# Patient Record
Sex: Male | Born: 1975 | Race: Black or African American | Hispanic: No | Marital: Married
Health system: Midwestern US, Community
[De-identification: ages and names within clinical notes are randomized; demographics above are authoritative.]

## PROBLEM LIST (undated history)

## (undated) DIAGNOSIS — Q211 Atrial septal defect, unspecified: Secondary | ICD-10-CM

## (undated) DIAGNOSIS — I639 Cerebral infarction, unspecified: Secondary | ICD-10-CM

## (undated) DIAGNOSIS — E785 Hyperlipidemia, unspecified: Secondary | ICD-10-CM

## (undated) HISTORY — DX: Hyperlipidemia, unspecified: E78.5

## (undated) HISTORY — PX: OTHER SURGICAL HISTORY: SHX169

## (undated) HISTORY — DX: Atrial septal defect, unspecified: Q21.10

## (undated) HISTORY — DX: Atrial septal defect: Q21.1

## (undated) HISTORY — DX: Cerebral infarction, unspecified: I63.9

---

## 2012-07-01 ENCOUNTER — Telehealth: Payer: Self-pay | Admitting: Cardiology

## 2012-07-01 NOTE — Telephone Encounter (Signed)
Spoke with patient who has appointment tomorrow with Dr. Antoine Poche for f/u s/p PFO at [facility] in [place], [place].  Patient states his cardiologist in [place], Dr. San Morelle recommends that he has ECG and ECHO one week, one month, and 6 months after procedure.  Patient questioned whether we have records from [place] and I informed him that Dr. Antoine Poche and Elita Quick are not here today. Patient asked if we had received his medical records from [place] and I informed him I was unsure. Patient states he has records and is dropping off today for [place] to make copies and return his copies to him tomorrow.

## 2012-07-01 NOTE — Telephone Encounter (Signed)
LMTCB

## 2012-07-01 NOTE — Telephone Encounter (Signed)
New problem   Pt want to know if he can have a Echocardiogram tomorrow with his appt. Please call pt

## 2012-07-02 ENCOUNTER — Encounter: Payer: Self-pay | Admitting: Cardiology

## 2012-07-02 ENCOUNTER — Ambulatory Visit (INDEPENDENT_AMBULATORY_CARE_PROVIDER_SITE_OTHER): Payer: BC Managed Care – PPO | Admitting: Cardiology

## 2012-07-02 VITALS — BP 128/81 | HR 66 | Ht 72.0 in | Wt 248.6 lb

## 2012-07-02 DIAGNOSIS — Q211 Atrial septal defect: Secondary | ICD-10-CM

## 2012-07-02 NOTE — Patient Instructions (Addendum)
Please stop your Lipitor. Continue all other medications as listed.  Your physician has requested that you have an echocardiogram at the end of May. Echocardiography is a painless test that uses sound waves to create images of your heart. It provides your doctor with information about the size and shape of your heart and how well your heart's chambers and valves are working. This procedure takes approximately one hour. There are no restrictions for this procedure.  Follow up with Dr Antoine Poche in 3 months.

## 2012-07-02 NOTE — Progress Notes (Signed)
HPI The patient presents for evaluation of a CVA. The patient was hospitalized in [place] for this. This was in mid April. I have reviewed outside records kindly provided. He had an acute lacunar left thalamic infarct.  He had an ASD.  He was treated with a Gore Helex ASD occluder. He had no residual deficit from his stroke. His initial complaint was visual. He has been managed with aspirin and Plavix. He has a neurology appointment pending. He has been back to doing activities though this was limited per protocol postprocedure. He denies any ongoing symptoms and in particular has had no motor or speech or visual disturbances. The patient denies any new symptoms such as chest discomfort, neck or arm discomfort. There has been no new shortness of breath, PND or orthopnea. There have been no reported palpitations, presyncope or syncope.  No Known Allergies  Current Outpatient Prescriptions  Medication Sig Dispense Refill  . aspirin 325 MG tablet Take 325 mg by mouth daily.      . cetirizine (ZYRTEC) 10 MG tablet Take 10 mg by mouth daily.      . clopidogrel (PLAVIX) 75 MG tablet Take 75 mg by mouth daily.       No current facility-administered medications for this visit.    Past Medical History  Diagnosis Date  . ASD (atrial septal defect)     Gore Helex Occluder ( REF [id number], LOT [id number])  . Dyslipidemia     Past Surgical History  Procedure Laterality Date  . 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 CAD Father 46  . Diabetes Mother     Type II    History   Social History  . Marital Status: Married    Spouse Name: N/A    Number of Children: 2  . Years of Education: N/A   Occupational History  .     Social History Main Topics  . Smoking status: Former Smoker    Types: Cigarettes    Start date: 07/02/1992  . Smokeless tobacco: Not on file  . Alcohol Use: Not on file  . Drug Use: Not on file  . Sexually Active: Not  on file   Other Topics Concern  . Not on file   Social History Narrative   Lives at home with wife.     ROS: As stated in the HPI and negative for all other systems.  PHYSICAL EXAM BP 128/81  Pulse 66  Ht 6' (1.829 m)  Wt 248 lb 9.6 oz (112.764 kg)  BMI 33.71 kg/m2 GENERAL:  Well appearing HEENT:  Pupils equal round and reactive, fundi not visualized, oral mucosa unremarkable NECK:  No jugular venous distention, waveform within normal limits, carotid upstroke brisk and symmetric, no bruits, no thyromegaly LYMPHATICS:  No cervical, inguinal adenopathy LUNGS:  Clear to auscultation bilaterally BACK:  No CVA tenderness CHEST:  Unremarkable HEART:  PMI not displaced or sustained,S1 and S2 within normal limits, no S3, no S4, no clicks, no rubs, no murmurs ABD:  Flat, positive bowel sounds normal in frequency in pitch, no bruits, no rebound, no guarding, no midline pulsatile mass, no hepatomegaly, no splenomegaly EXT:  2 plus pulses throughout, no edema, no cyanosis no clubbing SKIN:  No rashes no nodules NEURO:  Cranial nerves II through XII grossly intact, motor grossly intact throughout PSYCH:  Cognitively intact, oriented to person place and time   EKG:  Sinus  rhythm, rate 66, axis within normal limits, intervals within normal limits, no acute ST-T wave changes.   ASSESSMENT AND PLAN  ASD.  The patient is status post a Gore Helex septal occluder.  He is now greater than two weeks post proceder and he can begin to increase his activity level. I gave him specific instructions on this.  He will remain on antiplatelet agents for 6 months. Given the fact that he did have a stroke associated with this for now he will continue aspirin and Plavix. Eventually I will likely continue him on a low dose of aspirin only. I will also await for opinion from neurology. Per protocol for this device he will need an echocardiogram one month post placement, 6 months post and 12 months post. I will  arrange this.  DYSLIPIDEMIA:  The patient recalls his most recent LDL to be in the 140s. He does not recall being told he had coronary disease. Current guidelines would not suggest a statin in this situation in the absence of vascular disease. Therefore, he can discontinue his Lipitor which he would prefer.

## 2012-07-15 ENCOUNTER — Ambulatory Visit (INDEPENDENT_AMBULATORY_CARE_PROVIDER_SITE_OTHER): Payer: BC Managed Care – PPO | Admitting: Neurology

## 2012-07-15 ENCOUNTER — Encounter: Payer: Self-pay | Admitting: Neurology

## 2012-07-15 ENCOUNTER — Other Ambulatory Visit: Payer: Self-pay | Admitting: Neurology

## 2012-07-15 VITALS — BP 112/78 | HR 68 | Temp 97.7°F | Resp 16 | Wt 249.0 lb

## 2012-07-15 DIAGNOSIS — Q211 Atrial septal defect: Secondary | ICD-10-CM

## 2012-07-15 DIAGNOSIS — I6350 Cerebral infarction due to unspecified occlusion or stenosis of unspecified cerebral artery: Secondary | ICD-10-CM

## 2012-07-15 DIAGNOSIS — I639 Cerebral infarction, unspecified: Secondary | ICD-10-CM

## 2012-07-15 DIAGNOSIS — Q2112 Patent foramen ovale: Secondary | ICD-10-CM

## 2012-07-15 NOTE — Progress Notes (Signed)
Nathan Mayer is a 37 year old male who is now status post repair of atrial septal defect 4 weeks ago.  He was driving in the middle of the day in [place] around one or 2 PM when he developed blurred vision in his entire visual field.  He saw 2 or 3 sets of white lines going down the road in the fall of the midline and he pulled off to the side.  He was able to walk around at that point and the medics came and while he was on the gurney, he also developed other symptoms such as garbled speech but also did not make sense.  In other words the words that he spoke were not only garbled but they were the wrong words for that situation and not the words he was thinking in his mind.  He failed a drift test having tripped on the right and he had difficulty counting fingers in the right visual field. He states that all of these deficits cleared up by 6 PM that day, or within 4-5 hours.  He had a CAT scan of the head and then he had an MRI which showed increased signal on the T2 sequences in the left thalamus.  He also MRI and MRA which were unrevealing and he had the echocardiogram which revealed the patent foramen ovale.  He is now being followed by cardiology on aspirin and Plavix together.  By the cardiologist feels after 6 months he may be able to go on aspirin alone in terms of the septal defect repair.  He is now referred for an opinion as to whether this would be appropriate from a stroke or a RIND point of view.  Review of symptoms is basically negative as he feels healthy and he does not have high blood pressure and all of his symptoms from the rind or stroke have resolved.  Past Medical History  Diagnosis Date  . ASD (atrial septal defect)     Gore Helex Occluder ( REF [id number], LOT [id number])  . Dyslipidemia     Current Outpatient Prescriptions on File Prior to Visit  Medication Sig Dispense Refill  . aspirin 325 MG tablet Take 325 mg by mouth daily.      . cetirizine (ZYRTEC) 10 MG tablet Take 10 mg by  mouth daily.      . clopidogrel (PLAVIX) 75 MG tablet Take 75 mg by mouth daily.       No current facility-administered medications on file prior to visit.   Review of patient's allergies indicates no known allergies.  History   Social History  . Marital Status: Married    Spouse Name: N/A    Number of Children: 2  . Years of Education: N/A   Occupational History  .     Social History Main Topics  . Smoking status: Former Smoker    Types: Cigarettes    Start date: 07/02/1992  . Smokeless tobacco: Never Used  . Alcohol Use: Yes  . Drug Use: Not on file  . Sexually Active: Not on file   Other Topics Concern  . Not on file   Social History Narrative   Lives at home with wife.     Family History  Problem Relation Age of Onset  . CAD Father 20  . Diabetes Mother     Type II    BP 112/78  Pulse 68  Temp(Src) 97.7 F (36.5 C)  Resp 16  Wt 249 lb (112.946 kg)  BMI 33.76 kg/m2  Alert  and oriented x 3.  Memory function appears to be intact.  Concentration and attention are normal for educational level and background.  Speech is fluent and without significant word finding difficulty.  Is aware of current events.  No carotid bruits detected.  Cranial nerve II through XII are within normal limits.  This includes normal optic discs and acuity, EOMI, PERLA, facial movement and sensation intact, hearing grossly intact, gag intact,Uvula raises symmetrically and tongue protrudes evenly. Motor strength is 5 over 5 throughout all limbs.  No atrophy, abnormal tone or tremors. Reflexes are 1+ and symmetric in the upper and lower extremities Sensory exam is intact. Coordination is intact for fine movements and rapid alternating movements in all limbs Gait and station are normal.   Impression: 1. RIND or reversible ischemic neurologic deficit clinically resolved within 5-6 hours.  He does show a lesion in the left Thalamus on the T2 images of the MR I. Given the lack of other risk  factors, it does seem highly probable that the septal defect is the primary etiology of the event.  Plan: Continue his Plavix and aspirin for now.  In terms of what is best after 6 months, we will get an opinion from Dr. Pearlean Brownie at [facility] in 4 months as to what is the best regimen going forward. Return here  p.r.n.

## 2012-07-16 ENCOUNTER — Telehealth: Payer: Self-pay | Admitting: *Deleted

## 2012-07-16 MED ORDER — ASPIRIN 325 MG PO TABS: 325.0000 mg | ORAL_TABLET | Freq: Every day | ORAL | Status: DC

## 2012-07-16 MED ORDER — CLOPIDOGREL BISULFATE 75 MG PO TABS: 75.0000 mg | ORAL_TABLET | Freq: Every day | ORAL | Status: DC

## 2012-07-16 NOTE — Telephone Encounter (Signed)
Patient calling to see if Dr Antoine Poche will refill his rx for Aspirin 325mg  daily and Plavix 75mg . He states he only has one month left and they were not originally prescriped by Dr Antoine Poche but would like to know if this is possible and can he get a return call either way. I let him know I will forward this to his (Dr Antoine Poche) nurse for approval and someone will call him back whether it has been approved.   Micki Riley, CMA

## 2012-07-16 NOTE — Addendum Note (Signed)
Addended by: Sharin Grave on: 07/16/2012 02:56 PM   Modules accepted: Orders

## 2012-07-18 ENCOUNTER — Ambulatory Visit (HOSPITAL_COMMUNITY): Payer: BC Managed Care – PPO | Attending: Cardiology

## 2012-07-18 DIAGNOSIS — I079 Rheumatic tricuspid valve disease, unspecified: Secondary | ICD-10-CM | POA: Insufficient documentation

## 2012-07-18 DIAGNOSIS — Z8673 Personal history of transient ischemic attack (TIA), and cerebral infarction without residual deficits: Secondary | ICD-10-CM | POA: Insufficient documentation

## 2012-07-18 DIAGNOSIS — Z87891 Personal history of nicotine dependence: Secondary | ICD-10-CM | POA: Insufficient documentation

## 2012-07-18 DIAGNOSIS — Q211 Atrial septal defect: Secondary | ICD-10-CM

## 2012-07-18 DIAGNOSIS — I059 Rheumatic mitral valve disease, unspecified: Secondary | ICD-10-CM | POA: Insufficient documentation

## 2012-07-18 DIAGNOSIS — Q2111 Secundum atrial septal defect: Secondary | ICD-10-CM | POA: Insufficient documentation

## 2012-07-18 NOTE — Progress Notes (Signed)
Echocardiogram performed.  

## 2012-07-23 NOTE — Telephone Encounter (Signed)
Called and left message for pt of results and to call back with any questions

## 2012-07-23 NOTE — Telephone Encounter (Signed)
New problem   Pt want to know results of his echocardiogram done on 07/18/12. Please call pt

## 2012-07-29 ENCOUNTER — Telehealth: Payer: Self-pay | Admitting: Cardiology

## 2012-07-29 ENCOUNTER — Telehealth: Payer: Self-pay | Admitting: *Deleted

## 2012-07-29 ENCOUNTER — Encounter: Payer: Self-pay | Admitting: Neurology

## 2012-07-29 ENCOUNTER — Ambulatory Visit (INDEPENDENT_AMBULATORY_CARE_PROVIDER_SITE_OTHER): Payer: BC Managed Care – PPO | Admitting: Neurology

## 2012-07-29 VITALS — BP 110/64 | HR 64 | Ht 72.0 in | Wt 259.0 lb

## 2012-07-29 DIAGNOSIS — I639 Cerebral infarction, unspecified: Secondary | ICD-10-CM

## 2012-07-29 DIAGNOSIS — I6350 Cerebral infarction due to unspecified occlusion or stenosis of unspecified cerebral artery: Secondary | ICD-10-CM

## 2012-07-29 HISTORY — DX: Cerebral infarction, unspecified: I63.9

## 2012-07-29 NOTE — Progress Notes (Signed)
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  He was previously healthy,in 06/14/2012 he was driving in the middle of the day in [place] around one PM when he developed blurred vision in his entire visual field, slurred speech, right facial drooping, he pulled over, his wife took over, he was able to walk around his vehicle, sat to the passenger side, then he beccome unresponsive, incoherent, difficulty with language, difficulty raising both arm against gravity, his wife called 911, he could walk towards ambulance, he was taken to ER at [facility],  He received iv tPA, by 2nd hours after symptoms onset, he had rapid symptoms improvement,   by 6pm, he was stroke ICU talking on the phone, back to normal.  MRI of the brain with and without contrast April 19th demonstrated an acute small left thalamic stroke, Workup has demonstrated atrial septal defect, he received Gore Helex ASD occluder at [facility]. Was noted to have elevated LDL 140, was put on Lipitor, also aspirin and Plavix, the plan is to keep double treatment with six-month, then aspirin 325 mg alone I he was evaluated by cardiologist  Dr. Gayland Curry.  Lipitor was stopped. He now has no residual neurological deficit.   Repeat echocardiogram may 20 second showed ejection fraction 50-55%, color Doppler showed small residual defect at ASD closure device  He was also evaluated by [facility] neurologist Dr. Smiley Houseman in May 19th, who suggested second opinion by stroke physician Dr. Pearlean Brownie  Review of Systems  Out of a complete 14 system review, the patient complains of only the following symptoms, and all other reviewed systems are negative.   Constitutional:   N/A Cardiovascular:  N/A Ear/Nose/Throat:  N/A Skin: N/A Eyes: N/A Respiratory: N/A Gastroitestinal: N/A    Hematology/Lymphatic:  N/A Endocrine:   N/A Musculoskeletal:N/A Allergy/Immunology: N/A Neurological: N/A Psychiatric:    N/A  PHYSICAL EXAMINATOINS:  Generalized: In no acute distress  Neck: Supple, no carotid bruits   Cardiac: Regular rate rhythm  Pulmonary: Clear to auscultation bilaterally  Musculoskeletal: No deformity  Neurological examination  Mentation: Alert oriented to time, place, history taking, and causual conversation  Assessment and plan:  37 years old [place] American male, with embolic stroke,  Cranial nerve II-XII: Pupils were equal round reactive to light extraocular movements were full, visual field were full on confrontational test. facial sensation and strength were normal. hearing was intact to finger rubbing bilaterally. Uvula tongue midline.  head turning and shoulder shrug and were normal and symmetric.Tongue protrusion into cheek strength was normal.  Motor: normal tone, bulk and strength.  Sensory: Intact to fine touch, pinprick, preserved vibratory sensation, and proprioception at toes.  Coordination: Normal finger to nose, heel-to-shin bilaterally there was no truncal ataxia  Gait: Rising up from seated position without assistance, normal stance, without trunk ataxia, moderate stride, good arm swing, smooth turning, able to perform tiptoe, and heel walking without difficulty.   Romberg signs: Negative  Deep tendon reflexes: Brachioradialis 2/2, biceps 2/2, triceps 2/2, patellar 2/2, Achilles 2/2, plantar responses were flexor bilaterally.  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 transcranial Doppler, right-to-left shunt monitoring 3 consult Dr. Pearlean Brownie

## 2012-07-29 NOTE — Telephone Encounter (Signed)
Walk in pt Form " Pt has Additional Questions" sent to Pam/Hochrein 07/29/12/KM

## 2012-07-29 NOTE — Telephone Encounter (Signed)
Received walk in sheet stating pt has further questions.  Called number listed and got a voicemail - left message to call back with questions/concerns.

## 2012-07-30 ENCOUNTER — Ambulatory Visit (INDEPENDENT_AMBULATORY_CARE_PROVIDER_SITE_OTHER): Payer: BC Managed Care – PPO | Admitting: Neurology

## 2012-07-30 ENCOUNTER — Encounter: Payer: Self-pay | Admitting: Neurology

## 2012-07-30 VITALS — BP 111/71 | HR 66 | Temp 97.4°F | Ht 72.0 in | Wt 249.0 lb

## 2012-07-30 DIAGNOSIS — E7849 Other hyperlipidemia: Secondary | ICD-10-CM

## 2012-07-30 DIAGNOSIS — I6350 Cerebral infarction due to unspecified occlusion or stenosis of unspecified cerebral artery: Secondary | ICD-10-CM

## 2012-07-30 DIAGNOSIS — E782 Mixed hyperlipidemia: Secondary | ICD-10-CM

## 2012-07-30 MED ORDER — ROSUVASTATIN CALCIUM 5 MG PO TABS: 5.0000 mg | ORAL_TABLET | Freq: Every day | ORAL | Status: DC

## 2012-07-30 NOTE — Progress Notes (Signed)
[facility] [address] [place]. [place] [address] [phone]       OFFICE CONSULT NOTE  Mr. Nathan Mayer Date of Birth:  [birth year] Medical Record Number:  [id number]   Referring MD: Nathan Hopper, MD & Dr Nathan Mayer Reason for Referral:  Stroke HPI: Nathan Mayer is a 37 year old  African American male who  was previously healthy,until 06/14/2012 he was driving in the middle of the day in [place] around 1 PM when he developed blurred vision in his entire visual field, slurred speech, right facial drooping, he pulled over, his wife took over, he was able to walk around his vehicle, sat to the passenger side, then he beccome unresponsive, incoherent, difficulty with language, difficulty raising both arm against gravity, his wife called 911, he could walk towards ambulance, he was taken to ER at [facility],  He received iv tPA, within 2 hours after symptoms onset, he had rapid symptoms improvement,   by 6pm, he was recovered in ICU talking on the phone, back to normal.  MRI of the brain with and without contrast April 19th personally reviewed demonstrated an acute small left medial thalamic infarct Workup has demonstrated atrial septal defect on transthoraxic echo ( actual report and images not available), he received Gore Helex ASD occluder at [facility]. Was noted to have elevated LDL 140, was put on Lipitor, also aspirin and Plavix, the plan is to keep double treatment with six-month, then aspirin 325 mg alone I he was evaluated by cardiologist  Dr. Kirtland Mayer.  Lipitor was stopped `` for lack of vascular risk``. He now has no residual neurological deficit.  CT angiogram of the brain and neck showed no significant extracranial or intestinal stenosis. CT perfusion study was normal. Repeat echocardiogram 07/16/12 second showed ejection fraction 50-55%, color Doppler showed small residual defect at ASD closure device Lower extremity venous Doppler was negative  for deep and thrombosis.  He was also evaluated by [facility] neurologist Dr. Smiley Mayer in May 19th, who suggested second opinion by me. He is no prior history of DVT, pulmonary embolism or significant health problems. 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 He does have family history of strokes and heart attacks in his dad and paternal uncles in their 4s. He denies excessive intake of alcohol, caffeinated drinks, health  drinks, marijuana, drugs of abuse or smoking. He quit smoking in 1995.  ROS:   14 system review of systems is positive for mild memory difficulties only. PMH:  Past Medical History  Diagnosis Date  . ASD (atrial septal defect)     Gore Helex Occluder ( REF [id number], LOT [id number])  . Dyslipidemia    Left medial thalamic infarct 06/14/2012  Social History:  History   Social History  . Marital Status: Married    Spouse Name: Nathan Mayer    Number of Children: 2  . 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 Smoking status: Former Smoker    Types: Cigarettes    Start date: 07/02/1992  . Smokeless tobacco: Never Used  . Alcohol Use: 1.2 oz/week    2 Cans of beer per week     Comment: Social  . Drug Use: No  . Sexually Active: Not on file   Other Topics Concern  . Not on file   Social History Narrative   Lives at home with wife Nathan Mayer ). Patient works for [employer], Art gallery manager,  and has a college education. Drinks caffeine daily.    Medications:   Current Outpatient Prescriptions on File Prior to Visit  Medication Sig Dispense Refill  . aspirin 325 MG tablet Take 1 tablet (325 mg total) by mouth daily.  30 tablet  6  . cetirizine (ZYRTEC) 10 MG tablet Take 10 mg by mouth as needed.       . clopidogrel (PLAVIX) 75 MG tablet Take 1 tablet (75 mg total) by mouth daily.  30 tablet  6   No current facility-administered medications on file prior to visit.    Allergies:  No Known Allergies  Physical  Exam General: well developed, well nourished , seated, in no evident distress Head: head normocephalic and atraumatic. Orohparynx benign Neck: supple with no carotid or supraclavicular bruits Cardiovascular: regular rate and rhythm, no murmurs Musculoskeletal: no deformity Skin:  no rash/petichiae Vascular:  Normal pulses all extremities  Neurologic Exam Mental Status: Awake and fully alert. Oriented to place and time. Recent and remote memory intact. Attention span, concentration and fund of knowledge appropriate. Mood and affect appropriate. Diminished recall 2/3. Animal naming test 13 Cranial Nerves: Fundoscopic exam reveals sharp disc margins. Pupils equal, briskly reactive to light. Extraocular movements full without nystagmus. Visual fields full to confrontation. Hearing intact. Facial sensation intact. Face, tongue, palate moves normally and symmetrically.  Motor: Normal bulk and tone. Normal strength in all tested extremity muscles. Sensory.: intact to tough and pinprick and vibratory.  Coordination: Rapid alternating movements normal in all extremities. Finger-to-nose and heel-to-shin performed accurately bilaterally. Gait and Station: Arises from chair without difficulty. Stance is normal. Gait demonstrates normal stride length and balance . Able to heel, toe and tandem walk without difficulty.  Reflexes: 1+ and symmetric. Toes downgoing.     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 Finding of atrial septal defect status post endovascular closure on 06/18/12 using the  GORE Helix occluder device. Etiology of the stroke likely cryptogenic and vascular risk factors identified includes hyperlipidemia and family history only. Lower extremity venous Dopplers were negative for DVT though the  circumstances of the patient's clinical presentation do favor paradoxical embolism.    PLAN: Continue Plavix for secondary to prevention and am not sure he requires aspirin in addition since he has Helix occluder device which is not as thrombogenic . I will refer him to Dr. Tonny Bollman interventional cardiologist for his opinion with regards to this. Start Crestor 5 mg daily for his elevated LDL with goal below 100 mg percent. I have encouraged him to diet and exercise regularly. Check fasting lipid profile, hemoglobin A1c, ANA panel, hypercoagulable panel labs and sickle cell screen. Check transcranial Doppler bubble study for adequacy of ASD closure and emboli monitoring.

## 2012-07-30 NOTE — Patient Instructions (Addendum)
He was advised to continue aspirin and Plavix for stroke prevention as well as start Crestor 5 mg daily for his hyperlipidemia. I discussed possible side effects for the patient and advised him to calm if needed. Referred to Dr. Tonny Bollman cardiology for appropriate antiplatelet therapy post ASD closure. Check fasting lipid profile, hemoglobin A1c, ANA panel, hypercoagulable panel and sickle cell screen. Check transcranial Doppler bubble study with emboli monitoring. Return for followup in 3 months with Jerrye Bushy, NP

## 2012-08-01 ENCOUNTER — Other Ambulatory Visit: Payer: Self-pay | Admitting: *Deleted

## 2012-08-01 DIAGNOSIS — Q211 Atrial septal defect: Secondary | ICD-10-CM

## 2012-08-01 NOTE — Telephone Encounter (Signed)
Left message for pt to call back if further questions

## 2012-08-03 LAB — ANTITHROMBIN PANEL
AT III AG PPP IMM-ACNC: 73 % — ABNORMAL LOW (ref 75–130)
AntiThromb III Func: 84 % (ref 75–135)

## 2012-08-03 LAB — ANA: Anti Nuclear Antibody(ANA): NEGATIVE

## 2012-08-03 LAB — LUPUS ANTICOAGULANT
Dilute Viper Venom Time: 32.6 s (ref 0.0–55.1)
dPT Confirm Ratio: 0.99 Ratio (ref 0.00–1.20)

## 2012-08-03 LAB — HOMOCYSTEINE: Homocysteine: 11 umol/L (ref 0.0–15.0)

## 2012-08-03 LAB — LIPID PANEL
HDL: 35 mg/dL — ABNORMAL LOW (ref >39)
Triglycerides: 51 mg/dL (ref 0–149)
VLDL Cholesterol Cal: 10 mg/dL (ref 5–40)

## 2012-08-03 LAB — PROTEIN C DEFICIENCY PROFILE
Protein C Activity: 88 % (ref 74–151)
Protein C Antigen: 58 % — ABNORMAL LOW (ref 70–140)

## 2012-08-03 LAB — HEMOGLOBIN A1C: Hgb A1c MFr Bld: 6 % — ABNORMAL HIGH (ref 4.8–5.6)

## 2012-08-03 LAB — CARDIOLIPIN ANTIBODY: Anticardiolipin IgA: <9 APL U/mL (ref 0–11)

## 2012-08-07 ENCOUNTER — Telehealth: Payer: Self-pay | Admitting: Neurology

## 2012-08-08 NOTE — Telephone Encounter (Signed)
I spoke to the patient and gave him results of lab work which are pretty much unremarkable except for elevated bad and decreased good cholesterol. He was advised to continue Crestor 5 mg daily and keep appointment with Dr. Excell Seltzer cardiologist.

## 2012-08-08 NOTE — Progress Notes (Signed)
Lab results all same fairly normal except for elevated cholesterol for which I have already advised him to take Crestor. Check lipid panel 6 weeks after starting Crestor.

## 2012-08-09 NOTE — Telephone Encounter (Signed)
Pt continues to not return my calls.  Will await his return call

## 2012-09-20 ENCOUNTER — Other Ambulatory Visit: Payer: BC Managed Care – PPO

## 2012-09-20 ENCOUNTER — Ambulatory Visit (INDEPENDENT_AMBULATORY_CARE_PROVIDER_SITE_OTHER): Payer: BC Managed Care – PPO

## 2012-09-20 ENCOUNTER — Ambulatory Visit (INDEPENDENT_AMBULATORY_CARE_PROVIDER_SITE_OTHER): Payer: BC Managed Care – PPO | Admitting: Neurology

## 2012-09-20 ENCOUNTER — Encounter: Payer: Self-pay | Admitting: Neurology

## 2012-09-20 VITALS — BP 114/76 | HR 63 | Ht 72.0 in | Wt 249.0 lb

## 2012-09-20 DIAGNOSIS — I6350 Cerebral infarction due to unspecified occlusion or stenosis of unspecified cerebral artery: Secondary | ICD-10-CM

## 2012-09-20 DIAGNOSIS — Z0289 Encounter for other administrative examinations: Secondary | ICD-10-CM

## 2012-09-20 DIAGNOSIS — Q211 Atrial septal defect: Secondary | ICD-10-CM

## 2012-09-20 NOTE — Patient Instructions (Addendum)
Discontinue Aspirin and stay on Plavix along per second a stroke prevention has had do not see any benefit of doing detected therapy beyond a few months after PFO closure. Continue Crestor for hyperlipidemia and check followup lipid profile. Check TCD bubble study today for adequacy of endovascular ASD closure. Return for followup in 3 months

## 2012-09-20 NOTE — Progress Notes (Signed)
[facility] [address] [place]. [place] [address] [phone]       OFFICE CONSULT NOTE  Mr. Nathan Mayer Date of Birth:  May 24, 1975 Medical Record Number:  [id number]   Referring MD: Murriel Hopper, MD & Dr Terrace Arabia Reason for Referral:  Stroke HPI: Nathan Mayer is a 37 year old  African American male who  was previously healthy,until 06/14/2012 he was driving in the middle of the day in [place] around 1 PM when he developed blurred vision in his entire visual field, slurred speech, right facial drooping, he pulled over, his wife took over, he was able to walk around his vehicle, sat to the passenger side, then he beccome unresponsive, incoherent, difficulty with language, difficulty raising both arm against gravity, his wife called 911, he could walk towards ambulance, he was taken to ER at [facility],  He received iv tPA, within 2 hours after symptoms onset, he had rapid symptoms improvement,   by 6pm, he was recovered in ICU talking on the phone, back to normal.  MRI of the brain with and without contrast April 19th personally reviewed demonstrated an acute small left medial thalamic infarct Workup has demonstrated atrial septal defect on transthoraxic echo ( actual report and images not available), he received Gore Helex ASD occluder at [facility]. Was noted to have elevated LDL 140, was put on Lipitor, also aspirin and Plavix, the plan is to keep double treatment with six-month, then aspirin 325 mg alone I he was evaluated by cardiologist  Dr. Kirtland Bouchard.  Lipitor was stopped `` for lack of vascular risk``. He now has no residual neurological deficit.  CT angiogram of the brain and neck showed no significant extracranial or intestinal stenosis. CT perfusion study was normal. Repeat echocardiogram 07/16/12 second showed ejection fraction 50-55%, color Doppler showed small residual defect at ASD closure device Lower extremity venous Doppler was negative  for deep and thrombosis.  He was also evaluated by [facility] neurologist Dr. Smiley Houseman in May 19th, who suggested second opinion by me. He is no prior history of DVT, pulmonary embolism or significant health problems. 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 He does have family history of strokes and heart attacks in his dad and paternal uncles in their 38s. He denies excessive intake of alcohol, caffeinated drinks, health  drinks, marijuana, drugs of abuse or smoking. He quit smoking in 1995. 09/20/12  he returns today for followup after his last visit on 07/30/12. He continues to do well without recurrence stroke or TIA symptoms. He is tolerating Crestor well without significant myalgias or arthralgias. He remains on aspirin and Plavix and has not stopped aspirin and despite my advise at last visit. He has not yet seen Dr Tonny Bollman but I have discussed with him and he agrees with stopping aspirin. He did undergo lab work on 07/31/12 which showed total cholesterol 163, HDL 35, LDL 100 mg percent. ANA was negative. Hemoglobin A1c was borderline at 6.0. Sickle cell screen was negative. Lupus anticoagulant was negative. Antiphospholipid antibodies were negative. Protein S activity antithrombin 3 for normal. Protein C antigen was low but activity was normal. Homocystine was normal  ROS:   14 system review of systems is negative for any complaints todayPMH:  Past Medical History  Diagnosis Date  . ASD (atrial septal defect)     Gore Helex Occluder ( REF [id number], LOT [id number])  . 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 Marital Status: Married    Spouse Name: Nathan Mayer    Number of Children: 2  . 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 Smoking status: Former Smoker    Types: Cigarettes    Start date: 07/02/1992  . Smokeless tobacco: Never Used  . Alcohol Use: 1.2 oz/week    2  Cans of beer per week     Comment: Social  . Drug Use: No  . Sexually Active: Not on file   Other Topics Concern  . Not on file   Social History Narrative   Lives at home with wife Nathan Mayer ). Patient works for [employer], Art gallery manager, and has a Naval architect. Drinks caffeine daily.    Medications:   Current Outpatient Prescriptions on File Prior to Visit  Medication Sig Dispense Refill  . aspirin 325 MG tablet Take 1 tablet (325 mg total) by mouth daily.  30 tablet  6  . cetirizine (ZYRTEC) 10 MG tablet Take 10 mg by mouth as needed.       . clopidogrel (PLAVIX) 75 MG tablet Take 1 tablet (75 mg total) by mouth daily.  30 tablet  6  . rosuvastatin (CRESTOR) 5 MG tablet Take 1 tablet (5 mg total) by mouth daily.  60 tablet  1   No current facility-administered medications on file prior to visit.    Allergies:  No Known Allergies Filed Vitals:   09/20/12 1458  BP: 114/76  Pulse: 63    Physical Exam General: well developed, well nourished , seated, in no evident distress Head: head normocephalic and atraumatic. Orohparynx benign Neck: supple with no carotid or supraclavicular bruits Cardiovascular: regular rate and rhythm, no murmurs Musculoskeletal: no deformity Skin:  no rash/petichiae Vascular:  Normal pulses all extremities  Neurologic Exam Mental Status: Awake and fully alert. Oriented to place and time. Recent and remote memory intact. Attention span, concentration and fund of knowledge appropriate. Mood and affect appropriate. Diminished recall 2/3. Animal naming test 13 Cranial Nerves: Fundoscopic exam  not done  . Pupils equal, briskly reactive to light. Extraocular movements full without nystagmus. Visual fields full to confrontation. Hearing intact. Facial sensation intact. Face, tongue, palate moves normally and symmetrically.  Motor: Normal bulk and tone. Normal strength in all tested extremity muscles. Sensory.: intact to tough and pinprick and vibratory.   Coordination: Rapid alternating movements normal in all extremities. Finger-to-nose and heel-to-shin performed accurately bilaterally. Gait and Station: Arises from chair without difficulty. Stance is normal. Gait demonstrates normal stride length and balance . Able to heel, toe and tandem walk without difficulty.  Reflexes: 1+ and symmetric. Toes downgoing.     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 Finding of atrial septal defect status post endovascular closure on 06/18/12 using the  GORE Helix occluder device. Etiology of the stroke likely cryptogenic and vascular risk factors identified includes hyperlipidemia and family history only. Lower extremity venous Dopplers were negative for DVT though the circumstances of the patient's clinical presentation do favor paradoxical embolism.    PLAN: Continue Plavix for secondary to prevention and am not sure he requires aspirin in addition since he has Helix occluder device which is not as thrombogenic . I have discussed this with   Dr. Casimiro Needle  [facility] interventional cardiologist who agrees.. Continue Crestor 5 mg daily for his elevated LDL with goal below 100 mg percent. And check f/u lipids. I have encouraged him to diet and exercise regularly.  Check transcranial Doppler bubble study for adequacy of ASD closure and emboli monitoring.F/U in 3 months.       [facility]      [address]      [place]. [place] [address] [phone]       TRANSCRANIAL DOPPLER BUBBLE STUDY   Nathan Mayer Date of Birth:  [birth year] Medical Record Number:  [id number]   Indications: Diagnostic Date of Procedure: 09/20/2012 Clinical History:  37 year-old patient with stroke and atrial septal defect status post endovascular closure Technical  Description:   Transcranial Doppler Bubble Study was performed at the bedside after taking written informed consent from the patient and explaining risk/benefits. Both middle cerebral arteries were insonated using a headset. And IV line was inserted in the left forearm by the RN using aseptic precautions. Agitated saline injection at rest and after valsalva maneuver did  result in multiple high intensity transient signals (HITS) at rest and partial curtain sign after valsalva.  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 Questions were answered.Advised repeat TCD Bubble study in 3 months.

## 2012-09-23 ENCOUNTER — Telehealth: Payer: Self-pay | Admitting: Cardiology

## 2012-09-23 NOTE — Telephone Encounter (Signed)
New prob  Pt would like to speak to a nurse regarding a prescription for crestor.  He also has a question regarding coming off aspirin.

## 2012-09-23 NOTE — Telephone Encounter (Signed)
Spoke with patient who has questions regarding stopping ASA and not stopping Crestor.  Patient did not realize that these medications have different actions and was confused as to why Dr. Pearlean Brownie wanted him to continue Crestor.  I explained the differences in ASA and Crestor and patient verbalized understanding.  Patient states he would like to come off the Crestor but that he is scheduled to have f/u lipid panel in 3 weeks and has an appointment 8/19 with Dr. Antoine Poche and is comfortable waiting until then to discuss.

## 2012-10-14 ENCOUNTER — Encounter: Payer: Self-pay | Admitting: *Deleted

## 2012-10-14 ENCOUNTER — Encounter: Payer: Self-pay | Admitting: Cardiology

## 2012-10-15 ENCOUNTER — Ambulatory Visit (INDEPENDENT_AMBULATORY_CARE_PROVIDER_SITE_OTHER): Payer: BC Managed Care – PPO | Admitting: Cardiology

## 2012-10-15 ENCOUNTER — Encounter: Payer: Self-pay | Admitting: Cardiology

## 2012-10-15 VITALS — BP 108/76 | HR 54 | Ht 72.0 in | Wt 241.1 lb

## 2012-10-15 DIAGNOSIS — E782 Mixed hyperlipidemia: Secondary | ICD-10-CM

## 2012-10-15 DIAGNOSIS — E7849 Other hyperlipidemia: Secondary | ICD-10-CM

## 2012-10-15 DIAGNOSIS — I6350 Cerebral infarction due to unspecified occlusion or stenosis of unspecified cerebral artery: Secondary | ICD-10-CM

## 2012-10-15 DIAGNOSIS — I639 Cerebral infarction, unspecified: Secondary | ICD-10-CM

## 2012-10-15 MED ORDER — ROSUVASTATIN CALCIUM 5 MG PO TABS: 5.0000 mg | ORAL_TABLET | Freq: Every day | ORAL | Status: DC

## 2012-10-15 MED ORDER — CLOPIDOGREL BISULFATE 75 MG PO TABS: 75.0000 mg | ORAL_TABLET | Freq: Every day | ORAL | Status: DC

## 2012-10-15 NOTE — Patient Instructions (Addendum)
The current medical regimen is effective;  continue present plan and medications.  Your physician has requested that you have an echocardiogram in November 2014. Echocardiography is a painless test that uses sound waves to create images of your heart. It provides your doctor with information about the size and shape of your heart and how well your heart's chambers and valves are working. This procedure takes approximately one hour. There are no restrictions for this procedure.  Follow up in 1 year with Dr Antoine Poche.  You will receive a letter in the mail 2 months before you are due.  Please call us when you receive this letter to schedule your follow up appointment.

## 2012-10-15 NOTE — Progress Notes (Signed)
   HPI The patient presents for evaluation of a CVA and an ASD that was treated with a Gore Helex ASD occluder in [place] in April of this year.  Since I last saw him a followup echocardiogram demonstrated stable placement of the device. He has had followup with Dr. Pearlean Brownie. He had a negative transcranial Doppler.  He denies any ongoing symptoms and in particular has had no motor or speech or visual disturbances. The patient denies any new symptoms such as chest discomfort, neck or arm discomfort. There has been no new shortness of breath, PND or orthopnea. There have been no reported palpitations, presyncope or syncope.  He is exercising routinely and is somewhat disappointed that he has not lost weight.  No Known Allergies  Current Outpatient Prescriptions  Medication Sig Dispense Refill  . cetirizine (ZYRTEC) 10 MG tablet Take 10 mg by mouth as needed.       . clopidogrel (PLAVIX) 75 MG tablet Take 1 tablet (75 mg total) by mouth daily.  30 tablet  6  . rosuvastatin (CRESTOR) 5 MG tablet Take 1 tablet (5 mg total) by mouth daily.  60 tablet  1   No current facility-administered medications for this visit.    Past Medical History  Diagnosis Date  . ASD (atrial septal defect)     Gore Helex Occluder ( REF [id number], LOT [id number])  . Dyslipidemia   . Stroke 07/29/2012    Past Surgical History  Procedure Laterality Date  . Pyloric stenosis      Surgery as child    ROS: As stated in the HPI and negative for all other systems.  PHYSICAL EXAM BP 108/76  Pulse 54  Ht 6' (1.829 m)  Wt 241 lb 1.9 oz (109.371 kg)  BMI 32.69 kg/m2 GENERAL:  Well appearing HEENT:  Pupils equal round and reactive, fundi not visualized, oral mucosa unremarkable NECK:  No jugular venous distention, waveform within normal limits, carotid upstroke brisk and symmetric, no bruits, no thyromegaly LYMPHATICS:  No cervical, inguinal adenopathy LUNGS:  Clear to auscultation bilaterally BACK:  No CVA tenderness CHEST:   Unremarkable HEART:  PMI not displaced or sustained,S1 and S2 within normal limits, no S3, no S4, no clicks, no rubs, no murmurs ABD:  Flat, positive bowel sounds normal in frequency in pitch, no bruits, no rebound, no guarding, no midline pulsatile mass, no hepatomegaly, no splenomegaly EXT:  2 plus pulses throughout, no edema, no cyanosis no clubbing SKIN:  No rashes no nodules NEURO:  Cranial nerves II through XII grossly intact, motor grossly intact throughout PSYCH:  Cognitively intact, oriented to person place and time   EKG:  Sinus rhythm, rate 54, axis within normal limits, intervals within normal limits, no acute ST-T wave changes.  PACs.  10/15/2012   ASSESSMENT AND PLAN  ASD.  The patient is status post a Gore Helex septal occluder.  His one month echo demonstrated stable placement. He is due for six-month followup echo and then we'll have another one in a year per protocol. He is continuing on Plavix. Aspirin has been discontinued.  DYSLIPIDEMIA:  He is reluctant to continue his statin but will for now. He has a lipid level pending.  WEIGHT:  We had a long discussion about this. We discussed specific strategies with diet and exercise.

## 2012-10-29 ENCOUNTER — Ambulatory Visit (INDEPENDENT_AMBULATORY_CARE_PROVIDER_SITE_OTHER): Payer: BC Managed Care – PPO | Admitting: Nurse Practitioner

## 2012-10-29 ENCOUNTER — Encounter: Payer: Self-pay | Admitting: Nurse Practitioner

## 2012-10-29 ENCOUNTER — Other Ambulatory Visit (INDEPENDENT_AMBULATORY_CARE_PROVIDER_SITE_OTHER): Payer: Self-pay

## 2012-10-29 VITALS — BP 107/65 | HR 59 | Ht 72.0 in | Wt 247.0 lb

## 2012-10-29 DIAGNOSIS — Q2111 Secundum atrial septal defect: Secondary | ICD-10-CM

## 2012-10-29 DIAGNOSIS — Q211 Atrial septal defect, unspecified: Secondary | ICD-10-CM

## 2012-10-29 DIAGNOSIS — E782 Mixed hyperlipidemia: Secondary | ICD-10-CM

## 2012-10-29 DIAGNOSIS — Z0289 Encounter for other administrative examinations: Secondary | ICD-10-CM

## 2012-10-29 DIAGNOSIS — I6350 Cerebral infarction due to unspecified occlusion or stenosis of unspecified cerebral artery: Secondary | ICD-10-CM

## 2012-10-29 DIAGNOSIS — E7849 Other hyperlipidemia: Secondary | ICD-10-CM

## 2012-10-29 NOTE — Patient Instructions (Addendum)
We will order the repeat Transcranial doppler study to check status of PFO.  This will be done on a Friday afternoon with Dr. Pearlean Brownie.  Follow up for follow up visit in 6 months.

## 2012-10-29 NOTE — Progress Notes (Signed)
[facility]  PATIENT: Nathan Mayer DOB: 02/21/1976   HISTORY FROM: patient, chart REASON FOR VISIT: routine follow up  HISTORY OF PRESENT ILLNESS:  Nathan Mayer is a 37 year old African American male who was previously healthy,until 06/14/2012 he was driving in the middle of the day in [place] around 1 PM when he developed blurred vision in his entire visual field, slurred speech, right facial drooping, he pulled over, his wife took over, he was able to walk around his vehicle, sat to the passenger side, then he beccome unresponsive, incoherent, difficulty with language, difficulty raising both arm against gravity, his wife called 911, he could walk towards ambulance, he was taken to ER at [facility],  He received iv tPA, within 2 hours after symptoms onset, he had rapid symptoms improvement, by 6pm, he was recovered in ICU talking on the phone, back to normal.  MRI of the brain with and without contrast April 19th personally reviewed demonstrated an acute small left medial thalamic infarct  Workup has demonstrated atrial septal defect on transthoraxic echo ( actual report and images not available), he received Gore Helex ASD occluder at [facility]. Was noted to have elevated LDL 140, was put on Lipitor, also aspirin and Plavix, the plan is to keep double treatment with six-month, then aspirin 325 mg alone I he was evaluated by cardiologist Dr. Kirtland Bouchard. Lipitor was stopped `` for lack of vascular risk``. He now has no residual neurological deficit.  CT angiogram of the brain and neck showed no significant extracranial or intestinal stenosis. CT perfusion study was normal.  Repeat echocardiogram 07/16/12 second showed ejection fraction 50-55%, color Doppler showed small residual defect at ASD closure device  Lower extremity venous Doppler was negative for deep and thrombosis.  He was also evaluated by [facility] neurologist Dr. Smiley Houseman in May 19th, who  suggested second opinion by me.  He is no prior history of DVT, pulmonary embolism or significant health problems. 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 He does have family history of strokes and heart attacks in his dad and paternal uncles in their 55s.  He denies excessive intake of alcohol, caffeinated drinks, health drinks, marijuana, drugs of abuse or smoking. He quit smoking in 1995.  09/20/12 he returns today for followup after his last visit on 07/30/12. He continues to do well without recurrence stroke or TIA symptoms. He is tolerating Crestor well without significant myalgias or arthralgias. He remains on aspirin and Plavix and has not stopped aspirin and despite my advise at last visit. He has not yet seen Dr Tonny Bollman but I have discussed with him and he agrees with stopping aspirin. He did undergo lab work on 07/31/12 which showed total cholesterol 163, HDL 35, LDL 100 mg percent. ANA was negative. Hemoglobin A1c was borderline at 6.0. Sickle cell screen was negative. Lupus anticoagulant was negative. Antiphospholipid antibodies were negative. Protein S activity antithrombin 3 for normal. Protein C antigen was low but activity was normal. Homocystine was normal.  UPDATE 10/30/12 (LL): Patient returns for routine follow up. He has been exercising regularly and has lost about 15 lbs. According to his home scale. Has been reducing carbs, feeling a little weaker because of it.  His blood pressure is trending downward in correlation to his weight loss.  He just had his lipid blood work checked this visit.  He would really like to be able to discontinue Crestor because of the cost.  He is taking daily Plavix for  stroke prevention, patient denies medication side effects, with no signs of bleeding or excessive bruising.  No new neurovascular complaints.  REVIEW OF SYSTEMS: Full 14 system review of systems performed and notable only for: constitutional: N/A  cardiovascular: N/A respiratory:  N/A endocrine: N/A  ear/nose/throat: N/A  Hematology/Lymph: N/A musculoskeletal: N/A skin: N/A genitourinary: N/A Gastrointestinal: N/A allergy/immunology: N/A neurological: N/A sleep: N/A psychiatric: N/A   ALLERGIES: No Known Allergies  HOME MEDICATIONS: Outpatient Prescriptions Prior to Visit  Medication Sig Dispense Refill  . cetirizine (ZYRTEC) 10 MG tablet Take 10 mg by mouth as needed.       . clopidogrel (PLAVIX) 75 MG tablet Take 1 tablet (75 mg total) by mouth daily.  90 tablet  3  . rosuvastatin (CRESTOR) 5 MG tablet Take 1 tablet (5 mg total) by mouth daily.  90 tablet  3   No facility-administered medications prior to visit.    PAST MEDICAL HISTORY: Past Medical History  Diagnosis Date  . ASD (atrial septal defect)     Gore Helex Occluder ( REF [id number], LOT [id number])  . Dyslipidemia   . Stroke 07/29/2012    PAST SURGICAL HISTORY: Past Surgical History  Procedure Laterality Date  . Pyloric stenosis      Surgery as child    FAMILY HISTORY: Family History  Problem Relation Age of Onset  . CAD Father 23  . Heart attack Father   . High blood pressure Father   . Cancer Father   . Depression Father   . Diabetes Mother     Type II    SOCIAL HISTORY: History   Social History  . Marital Status: Married    Spouse Name: [name]    Number of Children: 2  . 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 Smoking status: Former Smoker    Types: Cigarettes    Start date: 07/02/1992  . Smokeless tobacco: Never Used  . Alcohol Use: 1.2 oz/week    2 Cans of beer per week     Comment: Social  . Drug Use: No  . Sexual Activity: Not on file   Other Topics Concern  . Not on file   Social History Narrative   Lives at home with wife [name]. Patient works for [employer], Art gallery manager, and has a Naval architect. Drinks caffeine daily.     PHYSICAL EXAM  Filed Vitals:   10/29/12 1042  BP: 107/65    Pulse: 59  Height: 6' (1.829 m)  Weight: 247 lb (112.038 kg)   Body mass index is 33.49 kg/(m^2).  Physical Exam  General: well developed, well nourished , seated, in no evident distress  Head: head normocephalic and atraumatic. Orohparynx benign  Neck: supple with no carotid or supraclavicular bruits  Cardiovascular: regular rate and rhythm, no murmurs  Musculoskeletal: no deformity  Skin: no rash/petichiae  Vascular: Normal pulses all extremities  Neurologic Exam  Mental Status: Awake and fully alert. Oriented to place and time. Recent and remote memory intact. Attention span, concentration and fund of knowledge appropriate. Mood and affect appropriate.  Cranial Nerves: Fundoscopic exam not done . Pupils equal, briskly reactive to light. Extraocular movements full without nystagmus. Visual fields full to confrontation. Hearing intact. Facial sensation intact. Face, tongue, palate moves normally and symmetrically.  Motor: Normal bulk and tone. Normal strength in all tested extremity muscles.  Sensory.: intact to tough and pinprick and vibratory.  Coordination: Rapid alternating movements normal in all extremities. Finger-to-nose and heel-to-shin performed accurately bilaterally.  Gait and Station: Arises from chair without difficulty. Stance is normal. Gait demonstrates normal stride length and balance . Able to heel, toe and tandem walk without difficulty.  Reflexes: 1+ and symmetric.   DIAGNOSTIC DATA (LABS, IMAGING, TESTING) - I reviewed patient records, labs, notes, testing and imaging myself where available.  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 Finding of atrial septal defect status post endovascular closure on 06/18/12 using the GORE Helix occluder device. Etiology of the stroke likely cryptogenic and vascular risk factors identified includes hyperlipidemia and family history only. Lower extremity venous Dopplers were negative for DVT though the circumstances of the patient's clinical presentation do favor paradoxical embolism.   PLAN:  Continue Plavix for secondary to prevention.  Continue Crestor 5 mg daily for his elevated LDL with goal below 100 mg percent. F/u lipids done today.  I have encouraged him to diet and exercise regularly. Check repeat transcranial Doppler bubble study for adequacy of ASD closure and emboli monitoring. F/U in 6 months.  Yossi Hinchman NP-C 10/29/2012, 10:49 AM  [facility] [address] [place], [place] [address] [phone]

## 2012-10-30 LAB — LIPID PANEL: HDL: 38 mg/dL — ABNORMAL LOW (ref >39)

## 2012-11-12 ENCOUNTER — Telehealth: Payer: Self-pay | Admitting: Nurse Practitioner

## 2012-11-13 ENCOUNTER — Telehealth: Payer: Self-pay | Admitting: Neurology

## 2012-11-13 NOTE — Telephone Encounter (Signed)
Patient requesting lab results

## 2012-11-14 NOTE — Telephone Encounter (Signed)
Called patient. Gave lab results

## 2012-11-14 NOTE — Telephone Encounter (Signed)
Please call Mr. Brizzi and give him the results to his cholesterol panel:  Total cholesterol: 130 Triglycerides: 55 HDL 38 (good cholesterol-slightly low) LDL 81(bad cholesterol- goal for diabetics is 70, non-diabetics 100).  I recommend continuing taking the statin.

## 2012-12-02 ENCOUNTER — Telehealth: Payer: Self-pay | Admitting: Cardiology

## 2012-12-02 NOTE — Telephone Encounter (Signed)
New problem   Pt need to speak to you concern his cholesterol level and getting off of Crestor.

## 2012-12-02 NOTE — Telephone Encounter (Signed)
Closed encounter by accident - pt calling because he doesn't want to take Crestor if possible and wants to know "what has to happen" so that he can come off Crestor.  Aware I will forward this information to Dr Antoine Poche for review and recommendations

## 2012-12-02 NOTE — Telephone Encounter (Signed)
Follow up     Patient returning call  Back to nurse

## 2012-12-02 NOTE — Telephone Encounter (Signed)
Left message to call back  

## 2012-12-05 ENCOUNTER — Telehealth: Payer: Self-pay | Admitting: Cardiology

## 2012-12-05 NOTE — Telephone Encounter (Signed)
New message  Pt states he is returning Dr Hochrein's call.  He said the doc himself called him.  Pls call Friday.

## 2012-12-05 NOTE — Telephone Encounter (Signed)
Please see other telephone note - pt wants to know what needs to occur before he can come off of statins all together.  Aware Dr Antoine Poche will call back.

## 2012-12-19 ENCOUNTER — Ambulatory Visit: Payer: BC Managed Care – PPO | Admitting: Neurology

## 2012-12-20 ENCOUNTER — Telehealth: Payer: Self-pay | Admitting: Cardiovascular Disease

## 2012-12-20 ENCOUNTER — Ambulatory Visit (INDEPENDENT_AMBULATORY_CARE_PROVIDER_SITE_OTHER): Payer: BC Managed Care – PPO | Admitting: Neurology

## 2012-12-20 ENCOUNTER — Ambulatory Visit (INDEPENDENT_AMBULATORY_CARE_PROVIDER_SITE_OTHER): Payer: Self-pay

## 2012-12-20 DIAGNOSIS — I6350 Cerebral infarction due to unspecified occlusion or stenosis of unspecified cerebral artery: Secondary | ICD-10-CM

## 2012-12-20 DIAGNOSIS — Z0289 Encounter for other administrative examinations: Secondary | ICD-10-CM

## 2012-12-20 DIAGNOSIS — Q211 Atrial septal defect: Secondary | ICD-10-CM

## 2012-12-20 NOTE — Telephone Encounter (Signed)
Called by Dr Pearlean Brownie and asked to evaluate patient. He has undergone ASD closure and has residual shunt based on positive TCD bubble study 6 months after closure. Will arrange appt.

## 2012-12-20 NOTE — Progress Notes (Signed)
[facility] [address] [place]. [place] [address] [phone]       OFFICE Follow up and TCD Bubble study NOTE  Mr. Marissa Lowrey Date of Birth:  05/08/1975 Medical Record Number:  [id number]   Referring MD: Murriel Hopper, MD & Dr Terrace Arabia Reason for Referral:  Stroke HPI: Alson is a 37 year old  African American male who  was previously healthy,until 06/14/2012 he was driving in the middle of the day in [place] around 1 PM when he developed blurred vision in his entire visual field, slurred speech, right facial drooping, he pulled over, his wife took over, he was able to walk around his vehicle, sat to the passenger side, then he beccome unresponsive, incoherent, difficulty with language, difficulty raising both arm against gravity, his wife called 911, he could walk towards ambulance, he was taken to ER at [facility],  He received iv tPA, within 2 hours after symptoms onset, he had rapid symptoms improvement,   by 6pm, he was recovered in ICU talking on the phone, back to normal.  MRI of the brain with and without contrast April 19th personally reviewed demonstrated an acute small left medial thalamic infarct Workup has demonstrated atrial septal defect on transthoraxic echo ( actual report and images not available), he received Gore Helex ASD occluder at [facility]. Was noted to have elevated LDL 140, was put on Lipitor, also aspirin and Plavix, the plan is to keep double treatment with six-month, then aspirin 325 mg alone I he was evaluated by cardiologist  Dr. Kirtland Bouchard.  Lipitor was stopped `` for lack of vascular risk``. He now has no residual neurological deficit.  CT angiogram of the brain and neck showed no significant extracranial or intestinal stenosis. CT perfusion study was normal. Repeat echocardiogram 07/16/12 second showed ejection fraction 50-55%, color Doppler showed small residual defect at ASD closure device Lower extremity  venous Doppler was negative for deep and thrombosis.  He was also evaluated by [facility] neurologist Dr. Smiley Houseman in May 19th, who suggested second opinion by me. He is no prior history of DVT, pulmonary embolism or significant health problems. 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 He does have family history of strokes and heart attacks in his dad and paternal uncles in their 3s. He denies excessive intake of alcohol, caffeinated drinks, health  drinks, marijuana, drugs of abuse or smoking. He quit smoking in 1995. 09/20/12  he returns today for followup after his last visit on 07/30/12. He continues to do well without recurrence stroke or TIA symptoms. He is tolerating Crestor well without significant myalgias or arthralgias. He remains on aspirin and Plavix and has not stopped aspirin and despite my advise at last visit. He has not yet seen Dr Tonny Bollman but I have discussed with him and he agrees with stopping aspirin. He did undergo lab work on 07/31/12 which showed total cholesterol 163, HDL 35, LDL 100 mg percent. ANA was negative. Hemoglobin A1c was borderline at 6.0. Sickle cell screen was negative. Lupus anticoagulant was negative. Antiphospholipid antibodies were negative. Protein S activity antithrombin 3 for normal. Protein C antigen was low but activity was normal. Homocystine was normal 12/20/12 for Followup after last visit 3 months ago. He continues to do well without recurrence stroke or TIA symptoms. He has discontinued aspirin and is taking Plavix along and tolerated well without significant bleeding or bruising. Is also tolerating Crestor well without muscle aches or pains. He has no new complaints today ROS:  14 system review of systems is negative for any complaints today PMH:  Past Medical History  Diagnosis Date  . ASD (atrial septal defect)     Gore Helex Occluder ( REF [id number], LOT [id number])  . 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 Marital Status: Married    Spouse Name: [name]    Number of Children: 2  . 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 Smoking status: Former Smoker    Types: Cigarettes    Start date: 07/02/1992  . Smokeless tobacco: Never Used  . Alcohol Use: 1.2 oz/week    2 Cans of beer per week     Comment: Social  . Drug Use: No  . Sexual Activity: Not on file   Other Topics Concern  . Not on file   Social History Narrative   Lives at home with wife [name]. Patient works for [employer], Art gallery manager, and has a Naval architect. Drinks caffeine daily.    Medications:   Current Outpatient Prescriptions on File Prior to Visit  Medication Sig Dispense Refill  . cetirizine (ZYRTEC) 10 MG tablet Take 10 mg by mouth as needed.       . clopidogrel (PLAVIX) 75 MG tablet Take 1 tablet (75 mg total) by mouth daily.  90 tablet  3  . rosuvastatin (CRESTOR) 5 MG tablet Take 1 tablet (5 mg total) by mouth daily.  90 tablet  3   No current facility-administered medications on file prior to visit.    Allergies:  No Known Allergies There were no vitals filed for this visit.  Physical Exam General: well developed, well nourished , seated, in no evident distress Head: head normocephalic and atraumatic. Orohparynx benign Neck: supple with no carotid or supraclavicular bruits Cardiovascular: regular rate and rhythm, no murmurs Musculoskeletal: no deformity Skin:  no rash/petichiae Vascular:  Normal pulses all extremities  Neurologic Exam Mental Status: Awake and fully alert. Oriented to place and time. Recent and remote memory intact. Attention span, concentration and fund of knowledge appropriate. Mood and affect appropriate.  Cranial Nerves: Fundoscopic exam  not done  . Pupils equal, briskly reactive to light. Extraocular movements full without nystagmus. Visual fields full to confrontation. Hearing intact. Facial  sensation intact. Face, tongue, palate moves normally and symmetrically.  Motor: Normal bulk and tone. Normal strength in all tested extremity muscles. Sensory.: intact to tough and pinprick and vibratory.  Coordination: Rapid alternating movements normal in all extremities. Finger-to-nose and heel-to-shin performed accurately bilaterally. Gait and Station: Arises from chair without difficulty. Stance is normal. Gait demonstrates normal stride length and balance . Able to heel, toe and tandem walk without difficulty.  Reflexes: 1+ and symmetric. Toes downgoing.     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 Finding of atrial septal defect status post endovascular closure on 06/18/12 using the  GORE Helix occluder device. Etiology of the stroke likely cryptogenic and vascular risk factors identified includes hyperlipidemia and family history only. Lower extremity venous Dopplers were negative for DVT though the circumstances of the patient's clinical presentation do favor paradoxical embolism.    PLAN: Continue Plavix for secondary to prevention   Continue Crestor 5 mg  daily for his elevated LDL with goal below 100 mg percent. And check f/u lipids. I have encouraged him to diet and exercise regularly.  Check transcranial Doppler bubble study for adequacy of ASD closure  As last study showed persistent right to left shunt despite endovascular closure .       [facility]      [address]      [place]. [place] [address] [phone]       TRANSCRANIAL DOPPLER BUBBLE STUDY   Mr. Cadence Haslam Date of Birth:  11-24-1975 Medical Record Number:  [id number]   Indications: Diagnostic Date of Procedure: 12/20/2012 Clinical History:  37 year-old patient with stroke and atrial septal  defect status post endovascular closure April 2014 Technical Description:   Transcranial Doppler Bubble Study was performed at the bedside after taking written informed consent from the patient and explaining risk/benefits. Both middle cerebral arteries were insonated using a headset. And IV line was inserted in the left forearm by the RN using aseptic precautions. Agitated saline injection at rest and after valsalva maneuver did  result in multiple high intensity transient signals (HITS) at rest and partial curtain sign after valsalva.  Impression:  Positive Transcranial Doppler Bubble Study indicative indicative of persistent small residual right to left intracardiac shunt despite endovascular ASD closure.. There were  50 microemboli detected   on the right and 42 on the left post valsalva today compared to 67 microemboli post valsalava on the right and 99 on the left during the previous study on 09/20/12. This shows only minimum to no improvement  Results were explained to the patient. Questions were answered.Advised to see Dr. Tonny Bollman interventional cardiologist to discuss possible further cardiac workup.Marland Kitchen

## 2012-12-24 NOTE — Telephone Encounter (Signed)
Pt scheduled to see Dr Excell Seltzer on 01/03/13.

## 2013-01-02 ENCOUNTER — Telehealth: Payer: Self-pay | Admitting: Cardiology

## 2013-01-02 NOTE — Telephone Encounter (Signed)
New message     Want [place] to know that she spoke with the pt about heart health diet information,  No need to call---this was an information only call

## 2013-01-03 ENCOUNTER — Ambulatory Visit (INDEPENDENT_AMBULATORY_CARE_PROVIDER_SITE_OTHER): Payer: BC Managed Care – PPO | Admitting: Cardiovascular Disease

## 2013-01-03 ENCOUNTER — Encounter: Payer: Self-pay | Admitting: Cardiovascular Disease

## 2013-01-03 ENCOUNTER — Encounter (INDEPENDENT_AMBULATORY_CARE_PROVIDER_SITE_OTHER): Payer: Self-pay

## 2013-01-03 VITALS — BP 121/76 | HR 54 | Ht 72.0 in | Wt 246.4 lb

## 2013-01-03 DIAGNOSIS — E7800 Pure hypercholesterolemia, unspecified: Secondary | ICD-10-CM

## 2013-01-03 DIAGNOSIS — I6350 Cerebral infarction due to unspecified occlusion or stenosis of unspecified cerebral artery: Secondary | ICD-10-CM

## 2013-01-03 DIAGNOSIS — Q211 Atrial septal defect: Secondary | ICD-10-CM

## 2013-01-03 NOTE — Progress Notes (Signed)
HPI:   This is a 37 year old gentleman with a history of atrial septal defect. He was previously healthy until April of this year when he had a stroke. He developed blurry vision, slurred speech, and right facial droop. He then became unresponsive with marked expressive aphasia. He received IV TPA and had full recovery without residual deficits. He was diagnosed with an ASD and was treated with a Gore helix ASD occluder in [place].  The patient has been followed in neurology by Dr. Pearlean Brownie. He has undergone serial transcranial Doppler studies which have demonstrated residual right to left intracardiac shunt. The surface echocardiogram also shows color Doppler evidence of small left to right interatrial shunt.  The patient feels well. He's had no recurrent neurologic events. He's had no chest pain, shortness of breath, or palpitations. He feels like he is having problems related to his statin drug. He complains of pain in his legs and feet. He has a strong desire to stop his statin drug.  Outpatient Encounter Prescriptions as of 01/03/2013  Medication Sig  . cetirizine (ZYRTEC) 10 MG tablet Take 10 mg by mouth as needed.   . clopidogrel (PLAVIX) 75 MG tablet Take 1 tablet (75 mg total) by mouth daily.  . rosuvastatin (CRESTOR) 5 MG tablet Take 1 tablet (5 mg total) by mouth daily.    No Known Allergies  Past Medical History  Diagnosis Date  . ASD (atrial septal defect)     Gore Helex Occluder ( REF [id number], LOT [id number])  . Dyslipidemia   . Stroke 07/29/2012   ROS: Negative except as per HPI  BP 121/76  Pulse 54  Ht 6' (1.829 m)  Wt 246 lb 6.4 oz (111.766 kg)  BMI 33.41 kg/m2  PHYSICAL EXAM: Pt is alert and oriented, NAD HEENT: normal Neck: JVP - normal, carotids 2+= without bruits Lungs: CTA bilaterally CV: RRR without murmur or gallop Abd: soft, NT, Positive BS, no hepatomegaly Ext: no C/C/E, distal pulses intact and equal Skin: warm/dry no rash  2D  Echo: ------------------------------------------------------------ Study Conclusions  - Left ventricle: The cavity size was normal. Wall thickness was increased in a pattern of mild LVH. Systolic function was normal. The estimated ejection fraction was in the range of 50% to 55%. Wall motion was normal; there were no regional wall motion abnormalities. Left ventricular diastolic function parameters were normal. - Mitral valve: Mild regurgitation. - Left atrium: The atrium was mildly dilated. Impressions:  - ASD closure device noted; there appears to be a small residual defect with color doppler.  ASSESSMENT AND PLAN: 1. Atrial septal defect status post transcatheter device closure. The patient has transcranial Doppler evidence of persistent right to left shunting and echo evidence of persistent left to right shunting by color Doppler. We reviewed potential mechanisms for this. I have recommended a transesophageal echocardiogram to better delineate his interatrial septal anatomy and to assess the degree of shunting. I suspect there will not be much we can do from a mechanical standpoint, but TEE should be able to clearly define the problem. I gave the patient the option of continued medical therapy versus TEE and he prefers to do the test. He will remain on clopidogrel. We'll schedule a TEE in the next few weeks.  2. Hyperlipidemia. We'll give him a statin holiday. He is motivated to make dietary and lifestyle modification. Will repeat lipids and LFTs in a few months. He understands the benefit of statin therapy after a stroke.  Kristapher Dubuque 01/03/2013 3:48 PM

## 2013-01-03 NOTE — Patient Instructions (Addendum)
Your physician has requested that you have a TEE. During a TEE, sound waves are used to create images of your heart. It provides your doctor with information about the size and shape of your heart and how well your heart's chambers and valves are working. In this test, a transducer is attached to the end of a flexible tube that's guided down your throat and into your esophagus (the tube leading from you mouth to your stomach) to get a more detailed image of your heart. You are not awake for the procedure. Please see the instruction sheet given to you today. For further information please visit [email or web address]   Your physician has recommended you make the following change in your medication: HOLD Crestor   Your physician recommends that you return for a FASTING LIPID and LIVER profile in 3 MONTHS--nothing to eat or drink after midnight, lab opens at 7:30 am (04/01/2013)

## 2013-01-21 ENCOUNTER — Ambulatory Visit (HOSPITAL_COMMUNITY)
Admission: RE | Admit: 2013-01-21 | Discharge: 2013-01-21 | Disposition: A | Discharge status: Home / Self Care | Payer: BC Managed Care – PPO | Source: Ambulatory Visit | Attending: Cardiology | Admitting: Cardiology

## 2013-01-21 ENCOUNTER — Encounter (HOSPITAL_COMMUNITY): Payer: Self-pay

## 2013-01-21 ENCOUNTER — Other Ambulatory Visit: Payer: Self-pay | Admitting: Nurse Practitioner

## 2013-01-21 ENCOUNTER — Encounter (HOSPITAL_COMMUNITY)
Admission: RE | Disposition: A | Discharge status: Home / Self Care | Payer: Self-pay | Source: Ambulatory Visit | Attending: Cardiology

## 2013-01-21 DIAGNOSIS — Z9889 Other specified postprocedural states: Secondary | ICD-10-CM | POA: Insufficient documentation

## 2013-01-21 DIAGNOSIS — Q211 Atrial septal defect: Secondary | ICD-10-CM

## 2013-01-21 DIAGNOSIS — Q2111 Secundum atrial septal defect: Secondary | ICD-10-CM | POA: Insufficient documentation

## 2013-01-21 DIAGNOSIS — E785 Hyperlipidemia, unspecified: Secondary | ICD-10-CM | POA: Insufficient documentation

## 2013-01-21 DIAGNOSIS — Z7902 Long term (current) use of antithrombotics/antiplatelets: Secondary | ICD-10-CM | POA: Insufficient documentation

## 2013-01-21 DIAGNOSIS — Z8673 Personal history of transient ischemic attack (TIA), and cerebral infarction without residual deficits: Secondary | ICD-10-CM | POA: Insufficient documentation

## 2013-01-21 HISTORY — PX: TEE WITHOUT CARDIOVERSION: SHX5443

## 2013-01-21 SURGERY — ECHOCARDIOGRAM, TRANSESOPHAGEAL
Anesthesia: Moderate Sedation

## 2013-01-21 MED ORDER — FENTANYL CITRATE 0.05 MG/ML IJ SOLN
INTRAMUSCULAR | Status: AC
  Filled 2013-01-21: qty 2

## 2013-01-21 MED ORDER — FENTANYL CITRATE 0.05 MG/ML IJ SOLN
INTRAMUSCULAR | Status: DC | PRN
  Administered 2013-01-21 (×2): 25 ug via INTRAVENOUS

## 2013-01-21 MED ORDER — BUTAMBEN-TETRACAINE-BENZOCAINE 2-2-14 % EX AERO
INHALATION_SPRAY | CUTANEOUS | Status: DC | PRN
  Administered 2013-01-21: 2 via TOPICAL

## 2013-01-21 MED ORDER — SODIUM CHLORIDE 0.9 % IV SOLN
INTRAVENOUS | Status: DC
  Administered 2013-01-21: 500 mL via INTRAVENOUS

## 2013-01-21 MED ORDER — DIPHENHYDRAMINE HCL 50 MG/ML IJ SOLN
INTRAMUSCULAR | Status: AC
  Filled 2013-01-21: qty 1

## 2013-01-21 MED ORDER — MIDAZOLAM HCL 10 MG/2ML IJ SOLN
INTRAMUSCULAR | Status: DC | PRN
  Administered 2013-01-21: 1 mg via INTRAVENOUS
  Administered 2013-01-21 (×2): 2 mg via INTRAVENOUS

## 2013-01-21 MED ORDER — MIDAZOLAM HCL 5 MG/ML IJ SOLN
INTRAMUSCULAR | Status: AC
  Filled 2013-01-21: qty 2

## 2013-01-21 NOTE — CV Procedure (Signed)
Procedure: TEE  Indication: s/p ASD closure, ?residual shunt  Sedation: Versed 5 mg IV, Fentanyl 50 mcg IV  Findings: Please see echo section for full report.  Normal LV size and systolic function, EF 55-60%.  Normal RV size and systolic function.  Trivial MR.  Trivial TR.  There was an ASD closure device in place on the interatrial septum.  There was a small amount of left to right flow noted centrally through the device between the "feet".  Bubble study was mildly positive.   Impression: Small amount of bidirectional flow through ASD occlusion device.  I do not think that there is much that can be done for this mechanically.  Continue Plavix.   Marca Ancona 01/21/2013 1:25 PM

## 2013-01-21 NOTE — Interval H&P Note (Signed)
History and Physical Interval Note:  01/21/2013 1:05 PM  Nathan Mayer  has presented today for surgery, with the diagnosis of FOLLOW UP RESIDUAL SHUNT FROM ASD SHUNT CLOSURE  The various methods of treatment have been discussed with the patient and family. After consideration of risks, benefits and other options for treatment, the patient has consented to  Procedure(s): TRANSESOPHAGEAL ECHOCARDIOGRAM (TEE) (N/A) as a surgical intervention .  The patient's history has been reviewed, patient examined, no change in status, stable for surgery.  I have reviewed the patient's chart and labs.  Questions were answered to the patient's satisfaction.     Ardyth Kelso [employer]

## 2013-01-21 NOTE — H&P (View-Only) (Signed)
HPI:   This is a 37-year-old gentleman with a history of atrial septal defect. He was previously healthy until April of this year when he had a stroke. He developed blurry vision, slurred speech, and right facial droop. He then became unresponsive with marked expressive aphasia. He received IV TPA and had full recovery without residual deficits. He was diagnosed with an ASD and was treated with a Gore helix ASD occluder in [place].  The patient has been followed in neurology by Dr. Sethi. He has undergone serial transcranial Doppler studies which have demonstrated residual right to left intracardiac shunt. The surface echocardiogram also shows color Doppler evidence of small left to right interatrial shunt.  The patient feels well. He's had no recurrent neurologic events. He's had no chest pain, shortness of breath, or palpitations. He feels like he is having problems related to his statin drug. He complains of pain in his legs and feet. He has a strong desire to stop his statin drug.  Outpatient Encounter Prescriptions as of 01/03/2013  Medication Sig  . cetirizine (ZYRTEC) 10 MG tablet Take 10 mg by mouth as needed.   . clopidogrel (PLAVIX) 75 MG tablet Take 1 tablet (75 mg total) by mouth daily.  . rosuvastatin (CRESTOR) 5 MG tablet Take 1 tablet (5 mg total) by mouth daily.    No Known Allergies  Past Medical History  Diagnosis Date  . ASD (atrial septal defect)     Gore Helex Occluder ( REF [id number], LOT [id number])  . Dyslipidemia   . Stroke 07/29/2012   ROS: Negative except as per HPI  BP 121/76  Pulse 54  Ht 6' (1.829 m)  Wt 246 lb 6.4 oz (111.766 kg)  BMI 33.41 kg/m2  PHYSICAL EXAM: Pt is alert and oriented, NAD HEENT: normal Neck: JVP - normal, carotids 2+= without bruits Lungs: CTA bilaterally CV: RRR without murmur or gallop Abd: soft, NT, Positive BS, no hepatomegaly Ext: no C/C/E, distal pulses intact and equal Skin: warm/dry no rash  2D  Echo: ------------------------------------------------------------ Study Conclusions  - Left ventricle: The cavity size was normal. Wall thickness was increased in a pattern of mild LVH. Systolic function was normal. The estimated ejection fraction was in the range of 50% to 55%. Wall motion was normal; there were no regional wall motion abnormalities. Left ventricular diastolic function parameters were normal. - Mitral valve: Mild regurgitation. - Left atrium: The atrium was mildly dilated. Impressions:  - ASD closure device noted; there appears to be a small residual defect with color doppler.  ASSESSMENT AND PLAN: 1. Atrial septal defect status post transcatheter device closure. The patient has transcranial Doppler evidence of persistent right to left shunting and echo evidence of persistent left to right shunting by color Doppler. We reviewed potential mechanisms for this. I have recommended a transesophageal echocardiogram to better delineate his interatrial septal anatomy and to assess the degree of shunting. I suspect there will not be much we can do from a mechanical standpoint, but TEE should be able to clearly define the problem. I gave the patient the option of continued medical therapy versus TEE and he prefers to do the test. He will remain on clopidogrel. We'll schedule a TEE in the next few weeks.  2. Hyperlipidemia. We'll give him a statin holiday. He is motivated to make dietary and lifestyle modification. Will repeat lipids and LFTs in a few months. He understands the benefit of statin therapy after a stroke.  Daxton Lue [place] 01/03/2013 3:48 PM      

## 2013-01-21 NOTE — Progress Notes (Signed)
Echo Lab  TEE completed.  Jarelis Ehlert L Oona Trammel, RDCS 01/21/2013 2:24 PM

## 2013-01-22 ENCOUNTER — Encounter (HOSPITAL_COMMUNITY): Payer: Self-pay | Admitting: Cardiology

## 2013-02-03 ENCOUNTER — Telehealth: Payer: Self-pay | Admitting: Cardiovascular Disease

## 2013-02-03 DIAGNOSIS — E7849 Other hyperlipidemia: Secondary | ICD-10-CM

## 2013-02-03 DIAGNOSIS — Q211 Atrial septal defect: Secondary | ICD-10-CM

## 2013-02-03 NOTE — Telephone Encounter (Signed)
Spoke with patient who wanted explanation as to whether he needs blood test for Plavix (he has been talking to his father who is on Coumadin).  I explained the difference to the patient who verbalized understanding. Patient states his dietician mentioned having a C-reactive protein test that would indicate how "inflammed his body is" and patient is wondering if Dr. Excell Seltzer will order this for his 2/3 lab appointment.  I advised that Dr. Excell Seltzer is not in the office today but that I will send message to him and someone will call the patient back when Dr. Excell Seltzer responds.

## 2013-02-03 NOTE — Telephone Encounter (Signed)
New message    Need to ask a question about a blood test to test for his clotting time---I think this is what he said

## 2013-02-04 NOTE — Telephone Encounter (Signed)
Called the patient and reviewed his questions about medication. Will add a CRP to his lab work in February. We discussed the findings of his TEE and agreed that continued antiplatelet therapy with either Plavix or aspirin 81 mg is the best approach. Since he is tolerating Plavix without problems, will continue this. He can follow-up as scheduled in August 2015 with Dr Antoine Poche who is his primary cardiologist.  Tonny Bollman 02/04/2013 4:15 PM

## 2013-02-04 NOTE — Telephone Encounter (Signed)
CRP order in epic for February lab appointment.

## 2013-04-01 ENCOUNTER — Other Ambulatory Visit (INDEPENDENT_AMBULATORY_CARE_PROVIDER_SITE_OTHER): Payer: BC Managed Care – PPO

## 2013-04-01 DIAGNOSIS — Q2111 Secundum atrial septal defect: Secondary | ICD-10-CM

## 2013-04-01 DIAGNOSIS — Q211 Atrial septal defect, unspecified: Secondary | ICD-10-CM

## 2013-04-01 DIAGNOSIS — E782 Mixed hyperlipidemia: Secondary | ICD-10-CM

## 2013-04-01 DIAGNOSIS — E7800 Pure hypercholesterolemia, unspecified: Secondary | ICD-10-CM

## 2013-04-01 DIAGNOSIS — E7849 Other hyperlipidemia: Secondary | ICD-10-CM

## 2013-04-01 LAB — LIPID PANEL
CHOLESTEROL: 187 mg/dL (ref 0–200)
HDL: 40.8 mg/dL (ref >39.00)
LDL CALC: 135 mg/dL — AB (ref 0–99)
TRIGLYCERIDES: 57 mg/dL (ref 0.0–149.0)
Total CHOL/HDL Ratio: 5
VLDL: 11.4 mg/dL (ref 0.0–40.0)

## 2013-04-01 LAB — C-REACTIVE PROTEIN: CRP: <0.5 mg/dL (ref 0.5–20.0)

## 2013-04-01 LAB — HEPATIC FUNCTION PANEL
ALK PHOS: 64 U/L (ref 39–117)
ALT: 24 U/L (ref 0–53)
AST: 20 U/L (ref 0–37)
Albumin: 3.8 g/dL (ref 3.5–5.2)
BILIRUBIN DIRECT: 0 mg/dL (ref 0.0–0.3)
TOTAL PROTEIN: 6.9 g/dL (ref 6.0–8.3)
Total Bilirubin: 0.7 mg/dL (ref 0.3–1.2)

## 2013-04-02 ENCOUNTER — Encounter (HOSPITAL_COMMUNITY): Payer: Self-pay | Admitting: Cardiovascular Disease

## 2013-05-23 ENCOUNTER — Encounter (HOSPITAL_BASED_OUTPATIENT_CLINIC_OR_DEPARTMENT_OTHER): Payer: Self-pay | Admitting: Emergency Medicine

## 2013-05-23 ENCOUNTER — Emergency Department (HOSPITAL_BASED_OUTPATIENT_CLINIC_OR_DEPARTMENT_OTHER): Payer: BC Managed Care – PPO

## 2013-05-23 ENCOUNTER — Emergency Department (HOSPITAL_BASED_OUTPATIENT_CLINIC_OR_DEPARTMENT_OTHER)
Admission: EM | Admit: 2013-05-23 | Discharge: 2013-05-23 | Disposition: A | Discharge status: Home / Self Care | Payer: BC Managed Care – PPO | Attending: Emergency Medicine | Admitting: Emergency Medicine

## 2013-05-23 DIAGNOSIS — Q2111 Secundum atrial septal defect: Secondary | ICD-10-CM | POA: Insufficient documentation

## 2013-05-23 DIAGNOSIS — Z8673 Personal history of transient ischemic attack (TIA), and cerebral infarction without residual deficits: Secondary | ICD-10-CM | POA: Insufficient documentation

## 2013-05-23 DIAGNOSIS — E785 Hyperlipidemia, unspecified: Secondary | ICD-10-CM | POA: Insufficient documentation

## 2013-05-23 DIAGNOSIS — Z79899 Other long term (current) drug therapy: Secondary | ICD-10-CM | POA: Insufficient documentation

## 2013-05-23 DIAGNOSIS — Z7982 Long term (current) use of aspirin: Secondary | ICD-10-CM | POA: Insufficient documentation

## 2013-05-23 DIAGNOSIS — R51 Headache: Secondary | ICD-10-CM | POA: Insufficient documentation

## 2013-05-23 DIAGNOSIS — H5789 Other specified disorders of eye and adnexa: Secondary | ICD-10-CM | POA: Insufficient documentation

## 2013-05-23 DIAGNOSIS — Z87891 Personal history of nicotine dependence: Secondary | ICD-10-CM | POA: Insufficient documentation

## 2013-05-23 DIAGNOSIS — Q211 Atrial septal defect: Secondary | ICD-10-CM | POA: Insufficient documentation

## 2013-05-23 DIAGNOSIS — R519 Headache, unspecified: Secondary | ICD-10-CM

## 2013-05-23 MED ORDER — BUTALBITAL-APAP-CAFFEINE 50-325-40 MG PO TABS: 1.0000 | ORAL_TABLET | Freq: Four times a day (QID) | ORAL | Status: AC | PRN

## 2013-05-23 NOTE — ED Notes (Signed)
Sent by Nance PewMitchell Mack PA for further evaluation . Pt states he has had a headache for several days and was seen at [facility] yesterday given rx for deltasone and muscle relaxer states has not helped his head ache. States headache goes into his neck and also causing his eye to tear

## 2013-05-23 NOTE — Discharge Instructions (Signed)

## 2013-05-23 NOTE — ED Provider Notes (Signed)
CSN: [id number]     Arrival date & time 05/23/13  0906 History   First MD Initiated Contact with Patient 05/23/13 0914     Chief Complaint  Patient presents with  . Headache     (Consider location/radiation/quality/duration/timing/severity/associated sxs/prior Treatment) Patient is a 38 y.o. male presenting with headaches. The history is provided by the patient.  Headache pt here complaining of left-sided headache which he has had for several days. History of similar headaches in the past associated with eye drainage but without focal neurological deficits. Was seen at urgent care Center yesterday for having some musculoskeletal neck pain and given prescription for prednisone as well as a muscle relaxant. Went back again today because he felt that he might have sinusitis. Patient is a nasal congestion without severe drainage. No fever or chills. No photophobia. No unilateral weakness, slurred speech, confusion. States he's had these symptoms multiple times in the past denies headache yesterday was located on the right side of his face and now is on the left side of his face. Denies any visual loss. No ataxia. Patient states usually he can take Excedrin blow his nose to make his symptoms better. He used this method 6 days ago and he did make things better. He did use Excedrin prior to arrival today and his headache is somewhat better.  Past Medical History  Diagnosis Date  . ASD (atrial septal defect)     Gore Helex Occluder ( REF [id number], LOT [id number])  . Dyslipidemia   . Stroke 07/29/2012   Past Surgical History  Procedure Laterality Date  . Pyloric stenosis      Surgery as child  . Tee without cardioversion N/A 01/21/2013    Procedure: TRANSESOPHAGEAL ECHOCARDIOGRAM (TEE);  Surgeon: Laurey Moralealton S McLean, MD;  Location: [facility] ENDOSCOPY;  Service: Cardiovascular;  Laterality: N/A;   Family History  Problem Relation Age of Onset  . CAD Father 7150  . Heart attack Father   . High blood pressure  Father   . Cancer Father   . Depression Father   . Diabetes Mother     Type II   History  Substance Use Topics  . Smoking status: Former Smoker    Types: Cigarettes    Start date: 07/02/1992  . Smokeless tobacco: Never Used  . Alcohol Use: 1.2 oz/week    2 Cans of beer per week     Comment: Social    Review of Systems  Neurological: Positive for headaches.  All other systems reviewed and are negative.      Allergies  Review of patient's allergies indicates no known allergies.  Home Medications   Current Outpatient Rx  Name  Route  Sig  Dispense  Refill  . aspirin EC 81 MG tablet   Oral   Take 81 mg by mouth daily.         . cetirizine (ZYRTEC) 10 MG tablet   Oral   Take 10 mg by mouth as needed.          . clopidogrel (PLAVIX) 75 MG tablet   Oral   Take 1 tablet (75 mg total) by mouth daily.   90 tablet   3   . rosuvastatin (CRESTOR) 5 MG tablet      ON HOLD until follow-up labs in 3 months   90 tablet   3    BP 147/87  Pulse 61  Temp(Src) 98.2 F (36.8 C)  Resp 18  SpO2 99% Physical Exam  Nursing note and vitals reviewed.  Constitutional: He is oriented to person, place, and time. He appears well-developed and well-nourished.  Non-toxic appearance. No distress.  HENT:  Head: Normocephalic and atraumatic.  Eyes: EOM and lids are normal. Pupils are equal, round, and reactive to light. Left eye exhibits no discharge and no exudate. Left conjunctiva is injected.  Neck: Normal range of motion. Neck supple. No tracheal deviation present. No mass present.  Cardiovascular: Normal rate, regular rhythm and normal heart sounds.  Exam reveals no gallop.   No murmur heard. Pulmonary/Chest: Effort normal and breath sounds normal. No stridor. No respiratory distress. He has no decreased breath sounds. He has no wheezes. He has no rhonchi. He has no rales.  Abdominal: Soft. Normal appearance and bowel sounds are normal. He exhibits no distension. There is no  tenderness. There is no rebound and no CVA tenderness.  Musculoskeletal: Normal range of motion. He exhibits no edema and no tenderness.  Neurological: He is alert and oriented to person, place, and time. He has normal strength. No cranial nerve deficit or sensory deficit. GCS eye subscore is 4. GCS verbal subscore is 5. GCS motor subscore is 6.  Skin: Skin is warm and dry. No abrasion and no rash noted.  Psychiatric: He has a normal mood and affect. His speech is normal and behavior is normal.    ED Course  Procedures (including critical care time) Labs Review Labs Reviewed - No data to display Imaging Review Ct Head Wo Contrast  05/23/2013   CLINICAL DATA:  Headache  EXAM: CT HEAD WITHOUT CONTRAST  TECHNIQUE: Contiguous axial images were obtained from the base of the skull through the vertex without intravenous contrast.  COMPARISON:  None.  FINDINGS: Ventricle size is normal. Negative for acute or chronic infarction. Negative for hemorrhage or fluid collection. Negative for mass or edema. No shift of the midline structures.  Calvarium is intact.  IMPRESSION: Normal   Electronically Signed   By: Marlan Palau M.D.   On: 05/23/2013 09:38     EKG Interpretation None      MDM   Final diagnoses:  None   Recent head CT negative here. Neurological assessment is normal. Patient's left eye does have some conjunctival injection but does not have any signs of drainage. Cornea is normal. The patient denies any ocular complaints. Do not think that he has glaucoma or iritis. Do not think that this represents conjunctivitis. Patient states he's had the similar symptoms for years and that this is no different. Patient is requesting referral to primary care doctor which I will provide him as well as give him a referral to an ENT doctor per his request     Toy Baker, MD 05/23/13 [phone]

## 2013-10-03 ENCOUNTER — Other Ambulatory Visit: Payer: Self-pay

## 2013-10-03 MED ORDER — CLOPIDOGREL BISULFATE 75 MG PO TABS: 75.0000 mg | ORAL_TABLET | Freq: Every day | ORAL | Status: DC

## 2014-07-30 ENCOUNTER — Other Ambulatory Visit: Payer: Self-pay | Admitting: Cardiovascular Disease

## 2014-12-24 NOTE — Telephone Encounter (Signed)
Error

## 2015-01-29 IMAGING — CT CT HEAD W/O CM
1 series · 16 of 30 positions shown, 20 images · non-contrast
Comparison: None.

CLINICAL DATA: Headache

EXAM:
CT HEAD WITHOUT CONTRAST
TECHNIQUE: Contiguous axial images were obtained from the base of the skull
through the vertex without intravenous contrast.

[Series 2: head 4.8 h37s · axial · 0.45mm/px · z∈[-40,+93]mm · 16 of 32 slices shown, 20 images]
[im 2/32  brain]
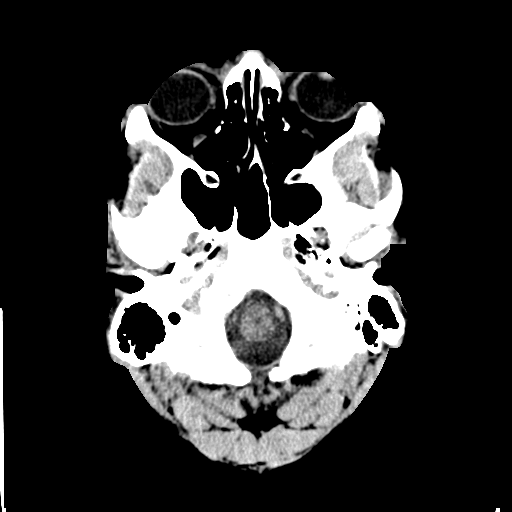
[im 2/32  bone]
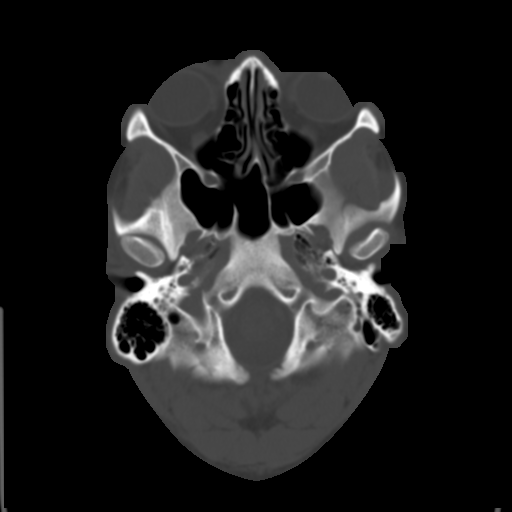
[im 4/32  brain]
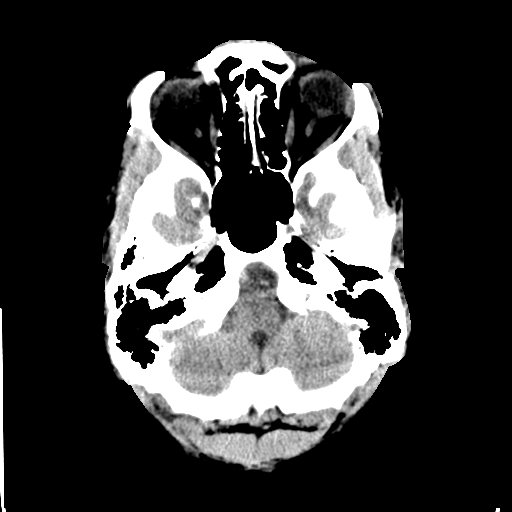
[im 6/32  brain]
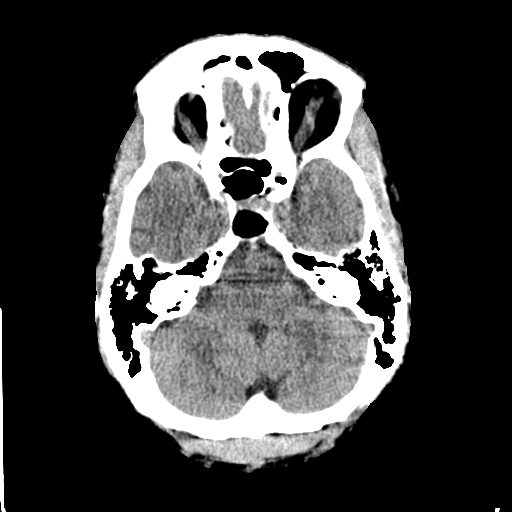
[im 8/32  brain]
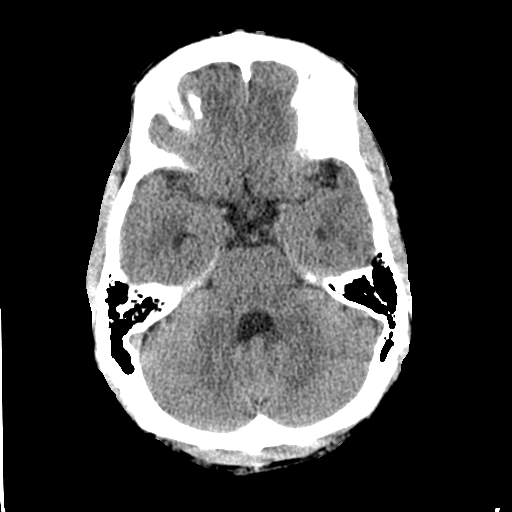
[im 9/32  brain]
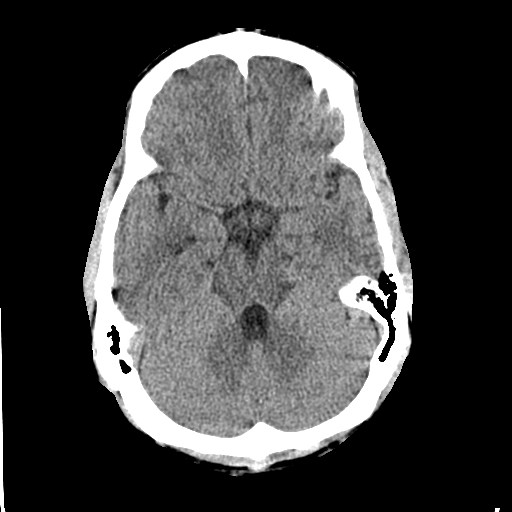
[im 9/32  bone]
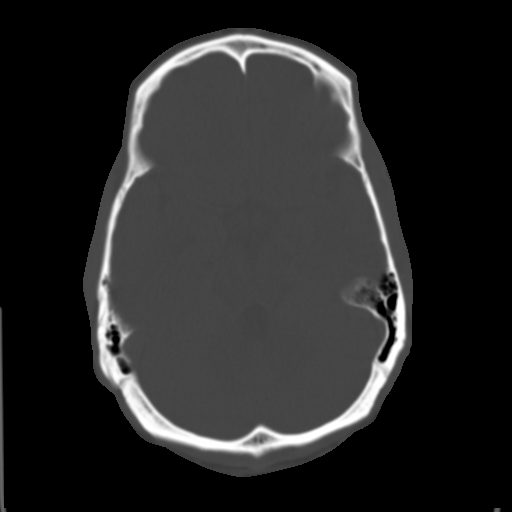
[im 11/32  brain]
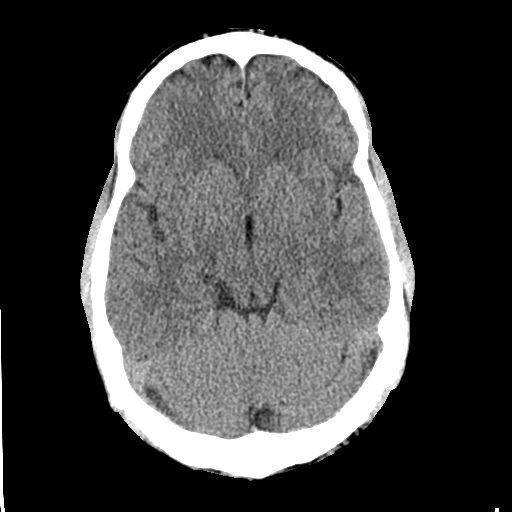
[im 13/32  brain]
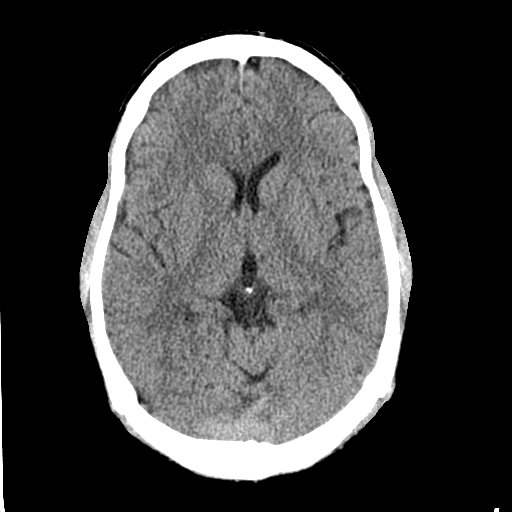
[im 15/32  brain]
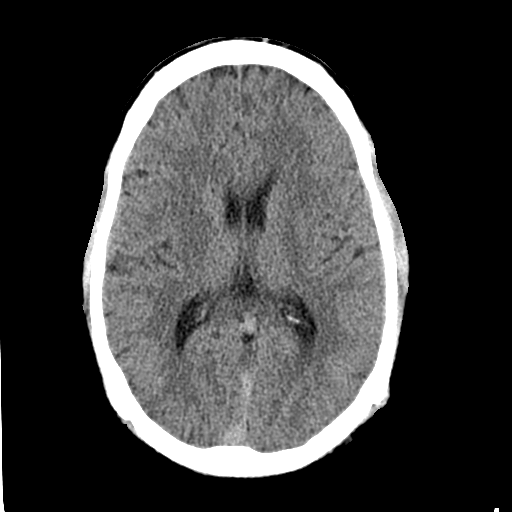
[im 17/32  brain]
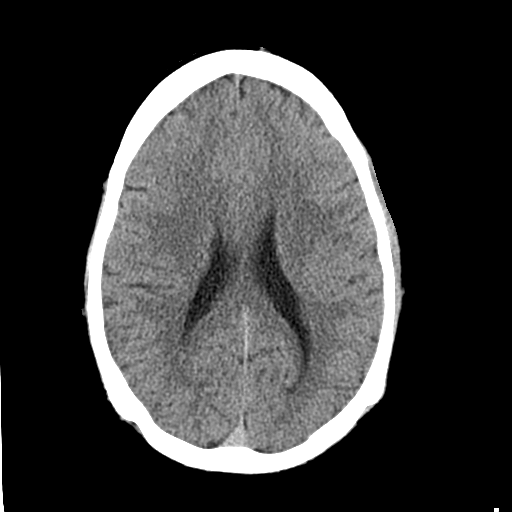
[im 17/32  bone]
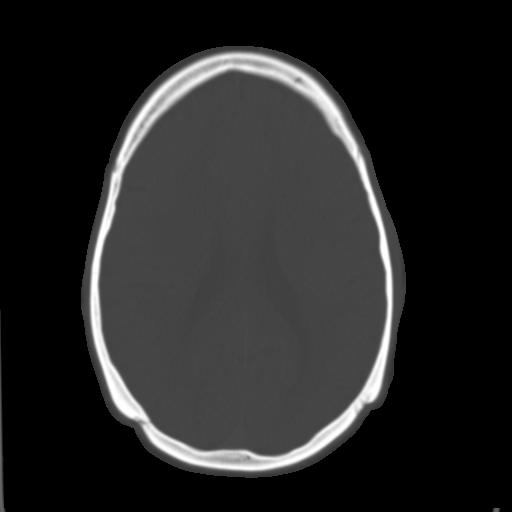
[im 19/32  brain]
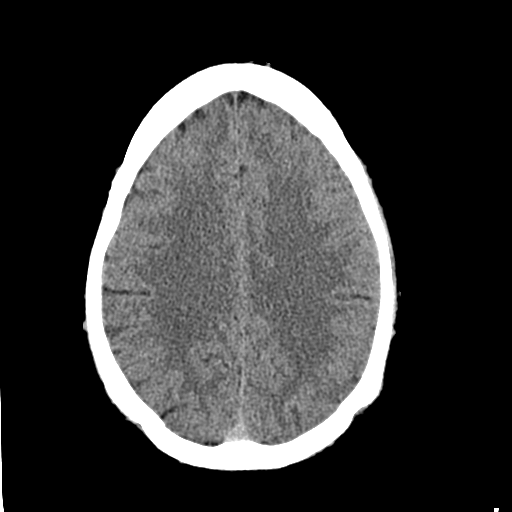
[im 21/32  brain]
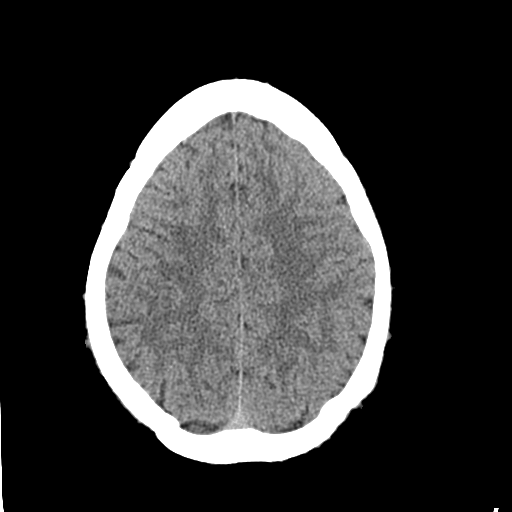
[im 23/32  brain]
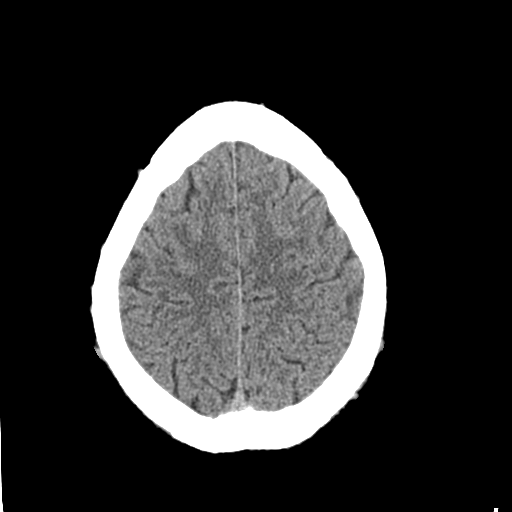
[im 24/32  brain]
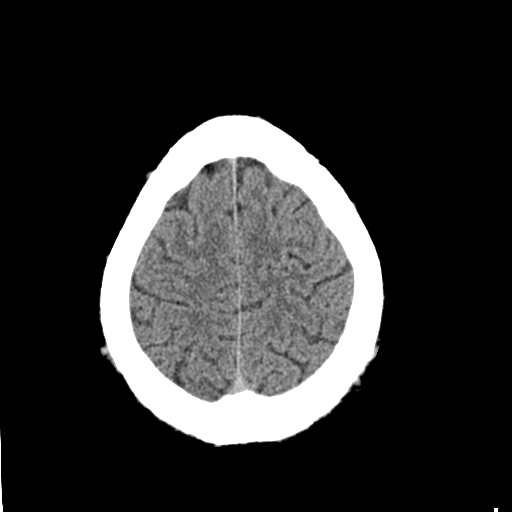
[im 24/32  bone]
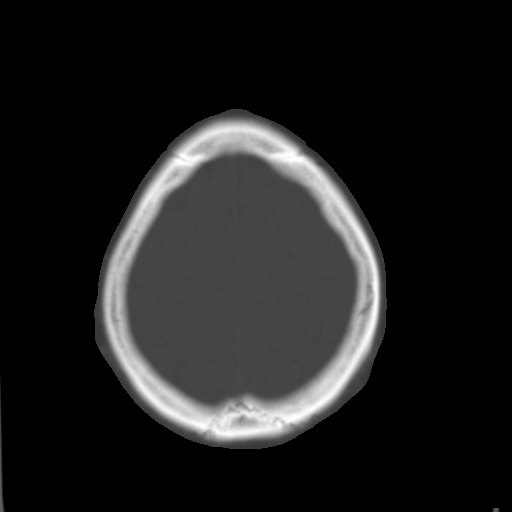
[im 26/32  brain]
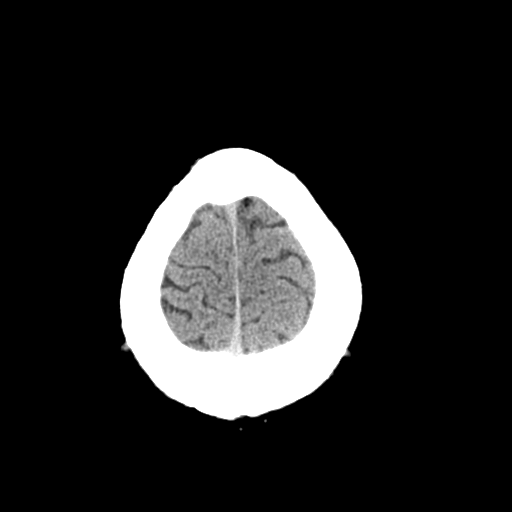
[im 28/32  brain]
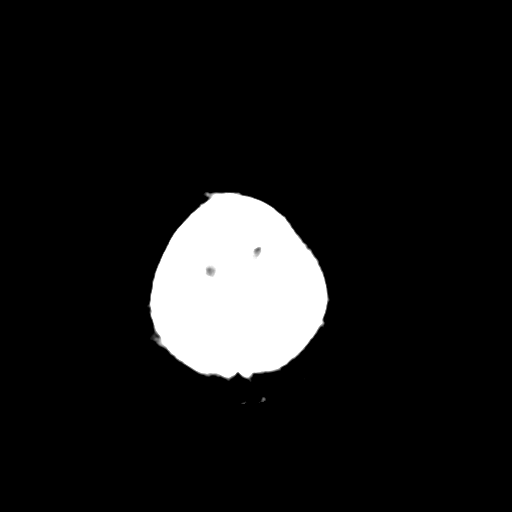
[im 30/32  brain]
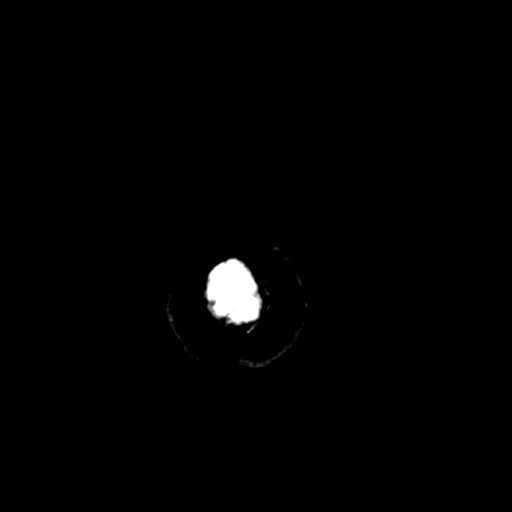

[16 of 30 positions shown; findings below may reference images not displayed]

FINDINGS: Ventricle size is normal. Negative for acute or chronic infarction.
Negative for hemorrhage or fluid collection. Negative for mass or
edema. No shift of the midline structures.

Calvarium is intact.
IMPRESSION: Normal

## 2015-05-31 ENCOUNTER — Other Ambulatory Visit: Payer: Self-pay | Admitting: Cardiovascular Disease

## 2015-06-01 NOTE — Telephone Encounter (Signed)
The pt's primary cardiologist is Dr Antoine PocheHochrein.  This request should be sent to him to address.   Will send as advised..  Sorry to bother you, im refilling in [place] and do not have a list for [place], this needs to go to Dr. Jenene SlickerHochrein's RN, can you help me please?

## 2015-06-01 NOTE — Telephone Encounter (Signed)
The pt's primary cardiologist is Dr Antoine PocheHochrein.  This request should be sent to him to address.

## 2018-03-01 ENCOUNTER — Ambulatory Visit: Attending: Specialist | Primary: Family Medicine

## 2018-03-01 ENCOUNTER — Ambulatory Visit
Admit: 2018-03-01 | Discharge: 2018-03-01 | Payer: PRIVATE HEALTH INSURANCE | Attending: Specialist | Primary: Family Medicine

## 2018-03-01 DIAGNOSIS — G43109 Migraine with aura, not intractable, without status migrainosus: Secondary | ICD-10-CM

## 2018-03-01 MED ORDER — SUMATRIPTAN 100 MG TAB
100 mg | ORAL_TABLET | Freq: Once | ORAL | 5 refills | Status: AC | PRN
Start: 2018-03-01 — End: 2018-03-01

## 2018-03-01 NOTE — Progress Notes (Signed)
Neurology Consult      Subjective:      Gregory Martinez is a 43 y.o. male Who comes in today with the following headache history.  Starts by saying 6 years ago he ended up with a stroke that apparently was was connected with a perforation in his heart perhaps a PFO?  At the time his stroke symptoms amounted to visual changes speech dysfunction and pretty much cleared these respective issues with time.  He says occasionally he will have a delay of recall to memory that other people may not notice if he does not slow down.    The new item on the agenda is a change of headaches in recent months.  Used to get maybe 1 or 2 in a month occasional occasionally intense and consistently and appropriately managed with over-the-counter remedies.  Then the headaches became more intense and frequent to several times a week and the onset would be gradual and sometimes announced with a visual aura and sometimes not.  Headache would be unilateral described as tight and intense and could last for hours.  We discover that with manual compression of his scalp he could help himself a little bit.  No associated nausea and vomiting.  Over-the-counter remedies really would not help.  Was given a nasal spray that seemed to work but was very expensive and will give [place] the name of that once he gets home.  Headaches are enhanced by light and less likely noise.  Stress is in the picture but has been decreasing in recent weeks.  Sleep less than best, averaging 4 to 6 hours, but again in recent weeks that has improved.  He says in terms of exercise he lifts weights about once a week.       No Known Allergies  Past Medical History:   Diagnosis Date   ??? Cerebral artery occlusion with cerebral infarction (HCC) 2014   ??? Headache    ??? Muscle pain    ??? Snoring       Past Surgical History:   Procedure Laterality Date   ??? CARDIAC SURG PROCEDURE UNLIST      PDO      Social History     Socioeconomic History   ??? Marital status: MARRIED      Spouse name: Not on file   ??? Number of children: Not on file   ??? Years of education: Not on file   ??? Highest education level: Not on file   Occupational History   ??? Not on file   Social Needs   ??? Financial resource strain: Not on file   ??? Food insecurity:     Worry: Not on file     Inability: Not on file   ??? Transportation needs:     Medical: Not on file     Non-medical: Not on file   Tobacco Use   ??? Smoking status: Never Smoker   ??? Smokeless tobacco: Never Used   Substance and Sexual Activity   ??? Alcohol use: Not Currently   ??? Drug use: Not on file   ??? Sexual activity: Not on file   Lifestyle   ??? Physical activity:     Days per week: Not on file     Minutes per session: Not on file   ??? Stress: Not on file   Relationships   ??? Social connections:     Talks on phone: Not on file     Gets together: Not on file  Attends religious service: Not on file     Active member of club or organization: Not on file     Attends meetings of clubs or organizations: Not on file     Relationship status: Not on file   ??? Intimate partner violence:     Fear of current or ex partner: Not on file     Emotionally abused: Not on file     Physically abused: Not on file     Forced sexual activity: Not on file   Other Topics Concern   ??? Not on file   Social History Narrative   ??? Not on file      Family History   Problem Relation Age of Onset   ??? Cancer Father    ??? Heart Disease Father    ??? Heart Disease Maternal Aunt    ??? Migraines Paternal Aunt    ??? Heart Disease Maternal Grandmother       Visit Vitals  BP 130/84   Pulse 75   Resp 18   Ht 6' (1.829 m)   Wt 117.5 kg (259 lb 1.6 oz)   SpO2 98%   BMI 35.14 kg/m??        Review of Systems:   A comprehensive review of systems was negative except for that written in the HPI.      Neuro Exam:     Appearance:  The patient is well developed, well nourished, provides a coherent history and is in no acute distress.   Mental Status: Oriented to time, place and person. Mood and affect appropriate.    Cranial Nerves:   Intact visual fields. Fundi are benign. PERLA, EOM's full, no nystagmus, no ptosis. Facial sensation is normal. Corneal reflexes are intact. Facial movement is symmetric. Hearing is normal bilaterally. Palate is midline with normal sternocleidomastoid and trapezius muscles are normal. Tongue is midline.   Motor:  5/5 strength in upper and lower proximal and distal muscles. Normal bulk and tone. No fasciculations.   Reflexes:   Deep tendon reflexes 2+/4 and symmetrical.   Sensory:   Normal to touch, pinprick and vibration.   Gait:  Normal gait.   Tremor:   No tremor noted.   Cerebellar:  No cerebellar signs present.   Neurovascular:  Normal heart sounds and regular rhythm, peripheral pulses intact, and no carotid bruits.            Assessment:   Migraine with and without visual aura.  Informed of headache preventative therapy but wants to hold off and commit to a rescue drug.  Will pick Imitrex 100 mg as needed and suggest he have it on his person and use it as soon as possible.  Can be used at moment 0 with an over-the-counter for potentially better combination effect.  Good good sleep minimize stress and exercise could help.  Continue to monitor headaches which could increase self-awareness.  Respecting his stroke history and the normalcy of these headaches in recent times and persistence we will get a brain MRI done.  FYI, let us know shortly before leaving the office this morning that his spray was sumatriptan nasal and I explained how it is the same thing as the oral drug were going to use for headache intervention.  Was given the timeframes as to how we could use both in the same day, but no more than 1 of each application, in any given day.    Habitual snoring?  Patient will check with his wife, and if  this is trending, would suggest a referral to sleep medicine, as this can make headaches harder to treat above and beyond considerations to  cardiopulmonary health blood pressure management etc.    Plan:   Revisit in about 2 months.  Signed by :  Chesley Noon IV MD

## 2018-03-01 NOTE — Patient Instructions (Addendum)
RESULT POLICY      If we have ordered testing for you, know that; "NO NEWS IS GOOD NEWS!"     It is our policy that we no longer call patients with results, nor do we  give test results over the phone.      We schedule follow up appointments so that your results can be discussed in person. This allows you to address any questions you have regarding the results.      If you choose to go to an imaging center outside of [facility], it is your responsibility to bring imaging report and disc to follow up appointment.    If something of concern is revealed on your test, we will contact you to discuss the matter and if needed schedule a sooner follow up appointment.    Additionally, results may be found by using the My Chart feature and one of our patient service representatives at the front desk can give you instructions on how to access this feature to utilize our electronic medical record system.     Thank you for your understanding.               Learning About Living Eugenie Birks  What is a living will?    A living will is a legal form you use to write down the kind of care you want at the end of your life. It is used by the health professionals who will treat you if you aren't able to decide for yourself.  If you put your wishes in writing, your loved ones and others will know what kind of care you want. They won't need to guess. This can ease your mind and be helpful to others.  A living will is not the same as an estate or property will. An estate will explains what you want to happen with your money and property after you die.  Is a living will a legal document?  A living will is a legal document. Each state has its own laws about living wills. If you move to another state, make sure that your living will is legal in the state where you now live. Or you might use a universal form that has been approved by many states. This kind of form can sometimes be completed and stored online. Your electronic copy will  then be available wherever you have a connection to the Internet. In most cases, doctors will respect your wishes even if you have a form from a different state.  ?? You don't need an attorney to complete a living will. But legal advice can be helpful if your state's laws are unclear, your health history is complicated, or your family can't agree on what should be in your living will.  ?? You can change your living will at any time. Some people find that their wishes about end-of-life care change as their health changes.  ?? In addition to making a living will, think about completing a medical power of attorney form. This form lets you name the person you want to make end-of-life treatment decisions for you (your "health care agent") if you're not able to. Many hospitals and nursing homes will give you the forms you need to complete a living will and a medical power of attorney.  ?? Your living will is used only if you can't make or communicate decisions for yourself anymore. If you become able to make decisions again, you can accept or refuse any treatment, no  matter what you wrote in your living will.  ?? Your state may offer an online registry. This is a place where you can store your living will online so the doctors and nurses who need to treat you can find it right away.  What should you think about when creating a living will?  Talk about your end-of-life wishes with your family members and your doctor. Let them know what you want. That way the people making decisions for you won't be surprised by your choices.  Think about these questions as you make your living will:  ?? Do you know enough about life support methods that might be used? If not, talk to your doctor so you know what might be done if you can't breathe on your own, your heart stops, or you're unable to swallow.  ?? What things would you still want to be able to do after you receive  life-support methods? Would you want to be able to walk? To speak? To eat on your own? To live without the help of machines?  ?? If you have a choice, where do you want to be cared for? In your home? At a hospital or nursing home?  ?? Do you want certain religious practices performed if you become very ill?  ?? If you have a choice at the end of your life, where would you prefer to die? At home? In a hospital or nursing home? Somewhere else?  ?? Would you prefer to be buried or cremated?  ?? Do you want your organs to be donated after you die?  What should you do with your living will?  ?? Make sure that your family members and your health care agent have copies of your living will.  ?? Give your doctor a copy of your living will to keep in your medical record. If you have more than one doctor, make sure that each one has a copy.  ?? You may want to put a copy of your living will where it can be easily found.  Where can you learn more?  Go to [employer].  Enter [phone] in the search box to learn more about "Learning About Living Wills."  Current as of: May 28, 2017  Content Version: 12.2  ?? 2006-2019 [employer]. Care instructions adapted under license by [employer] (which disclaims liability or warranty for this information). If you have questions about a medical condition or this instruction, always ask your healthcare professional. [employer] disclaims any warranty or liability for your use of this information.    Patient history reviewed patient examined.  Respecting his previous stroke history and the new onset of headaches would like to get an updated scan of the head with a brain MRI.  We will try to treat these headaches is migraines with a rescue drug which can be used at moment 0 with an over-the-counter remedy for potentially a better combination effect.  Headaches respond better and obviously quicker as soon as the treatment is  delivered.  Will let us know what the name of the nasal spray was for headache rescue.  Good good sleep minimize stress and exercise could help.  He says he will keep track of the snoring at night, as we do not want to miss obstructive sleep apnea, which among other things makes headaches harder to treat.  A formal referral to sleep medicine would take place if it looks like it is a significant feature of his nighttime sleep.  Was  informed about preventative medication intervention for headaches but is comfortable with just the rescue approach for now.  Continue to document headaches to improve self-awareness.

## 2018-03-01 NOTE — Progress Notes (Signed)
Reviewed record in preparation for visit and have necessary documentation  Pt did not bring medication to office visit for review  Information was given to pt on Advanced Directives, Living Will  opportunity was given for questions

## 2018-03-01 NOTE — Progress Notes (Signed)
Neurology Consult      Subjective:      Gregory Martinez is a 43 y.o. male Who comes in today with the following headache history.  Starts by saying 6 years ago he ended up with a stroke that apparently was was connected with a perforation in his heart perhaps a PFO?  At the time his stroke symptoms amounted to visual changes speech dysfunction and pretty much cleared these respective issues with time.  He says occasionally he will have a delay of recall to memory that other people may not notice if he does not slow down.    The new item on the agenda is a change of headaches in recent months.  Used to get maybe 1 or 2 in a month occasional occasionally intense and consistently and appropriately managed with over-the-counter remedies.  Then the headaches became more intense and frequent to several times a week and the onset would be gradual and sometimes announced with a visual aura and sometimes not.  Headache would be unilateral described as tight and intense and could last for hours.  We discover that with manual compression of his scalp he could help himself a little bit.  No associated nausea and vomiting.  Over-the-counter remedies really would not help.  Was given a nasal spray that seemed to work but was very expensive and will give [place] the name of that once he gets home.  Headaches are enhanced by light and less likely noise.  Stress is in the picture but has been decreasing in recent weeks.  Sleep less than best, averaging 4 to 6 hours, but again in recent weeks that has improved.  He says in terms of exercise he lifts weights about once a week.       No Known Allergies  Past Medical History:   Diagnosis Date   ??? Cerebral artery occlusion with cerebral infarction (HCC) 2014   ??? Headache    ??? Muscle pain    ??? Snoring       Past Surgical History:   Procedure Laterality Date   ??? CARDIAC SURG PROCEDURE UNLIST      PDO      Social History     Socioeconomic History   ??? Marital status: MARRIED     Spouse name: Not  on file   ??? Number of children: Not on file   ??? Years of education: Not on file   ??? Highest education level: Not on file   Occupational History   ??? Not on file   Social Needs   ??? Financial resource strain: Not on file   ??? Food insecurity:     Worry: Not on file     Inability: Not on file   ??? Transportation needs:     Medical: Not on file     Non-medical: Not on file   Tobacco Use   ??? Smoking status: Never Smoker   ??? Smokeless tobacco: Never Used   Substance and Sexual Activity   ??? Alcohol use: Not Currently   ??? Drug use: Not on file   ??? Sexual activity: Not on file   Lifestyle   ??? Physical activity:     Days per week: Not on file     Minutes per session: Not on file   ??? Stress: Not on file   Relationships   ??? Social connections:     Talks on phone: Not on file     Gets together: Not on file  Attends religious service: Not on file     Active member of club or organization: Not on file     Attends meetings of clubs or organizations: Not on file     Relationship status: Not on file   ??? Intimate partner violence:     Fear of current or ex partner: Not on file     Emotionally abused: Not on file     Physically abused: Not on file     Forced sexual activity: Not on file   Other Topics Concern   ??? Not on file   Social History Narrative   ??? Not on file      Family History   Problem Relation Age of Onset   ??? Cancer Father    ??? Heart Disease Father    ??? Heart Disease Maternal Aunt    ??? Migraines Paternal Aunt    ??? Heart Disease Maternal Grandmother       Visit Vitals  BP 130/84   Pulse 75   Resp 18   Ht 6' (1.829 m)   Wt 117.5 kg (259 lb 1.6 oz)   SpO2 98%   BMI 35.14 kg/m??        Review of Systems:   A comprehensive review of systems was negative except for that written in the HPI.      Neuro Exam:     Appearance:  The patient is well developed, well nourished, provides a coherent history and is in no acute distress.   Mental Status: Oriented to time, place and person. Mood and affect appropriate.   Cranial Nerves:    Intact visual fields. Fundi are benign. PERLA, EOM's full, no nystagmus, no ptosis. Facial sensation is normal. Corneal reflexes are intact. Facial movement is symmetric. Hearing is normal bilaterally. Palate is midline with normal sternocleidomastoid and trapezius muscles are normal. Tongue is midline.   Motor:  5/5 strength in upper and lower proximal and distal muscles. Normal bulk and tone. No fasciculations.   Reflexes:   Deep tendon reflexes 2+/4 and symmetrical.   Sensory:   Normal to touch, pinprick and vibration.   Gait:  Normal gait.   Tremor:   No tremor noted.   Cerebellar:  No cerebellar signs present.   Neurovascular:  Normal heart sounds and regular rhythm, peripheral pulses intact, and no carotid bruits.            Assessment:   Migraine with and without visual aura.  Informed of headache preventative therapy but wants to hold off and commit to a rescue drug.  Will pick Imitrex 100 mg as needed and suggest he have it on his person and use it as soon as possible.  Can be used at moment 0 with an over-the-counter for potentially better combination effect.  Good good sleep minimize stress and exercise could help.  Continue to monitor headaches which could increase self-awareness.  Respecting his stroke history and the normalcy of these headaches in recent times and persistence we will get a brain MRI done.  FYI, let us know shortly before leaving the office this morning that his spray was sumatriptan nasal and I explained how it is the same thing as the oral drug were going to use for headache intervention.  Was given the timeframes as to how we could use both in the same day, but no more than 1 of each application, in any given day.    Habitual snoring?  Patient will check with his wife, and if  this is trending, would suggest a referral to sleep medicine, as this can make headaches harder to treat above and beyond considerations to cardiopulmonary health blood pressure management etc.    Plan:    Revisit in about 2 months.  Signed by :  Chesley Noon IV MD

## 2018-03-08 ENCOUNTER — Inpatient Hospital Stay: Admit: 2018-03-08 | Payer: BLUE CROSS/BLUE SHIELD | Attending: Specialist | Primary: Family Medicine

## 2018-03-08 DIAGNOSIS — G43109 Migraine with aura, not intractable, without status migrainosus: Secondary | ICD-10-CM

## 2018-03-18 ENCOUNTER — Encounter: Attending: Family Medicine | Primary: Family Medicine

## 2018-03-27 ENCOUNTER — Ambulatory Visit: Attending: Family Medicine | Primary: Family Medicine

## 2018-03-27 ENCOUNTER — Ambulatory Visit
Admit: 2018-03-27 | Discharge: 2018-03-27 | Payer: PRIVATE HEALTH INSURANCE | Attending: Family Medicine | Primary: Family Medicine

## 2018-03-27 DIAGNOSIS — E782 Mixed hyperlipidemia: Secondary | ICD-10-CM

## 2018-03-27 NOTE — Patient Instructions (Signed)
LOW CAL DIET     Drink 64 ounces of water each day  Exercise at least 150 mins a week    Breakfast  Either a premier protein meal replacement( 30 g of protein) or 2 boiled eggs  And 1 piece of fruit( apple, orange, banana, grapefruit or pear)      Lunch  Either a premier protein drink (30 G ) or 3 ounces of lean meat and 2 servings of any leafy green vegetables. Can also have as 1 option for vegetables pinto beans or green peas.  AND 1 piece of fruit as listed above    Snack: premier protein drink    Dinner  4 ounces of lean meat with 2 servings of vegetables ( as listed above)  Also may have a small-medium baked sweet potato    For tea or coffee use stevia sweetener

## 2018-03-27 NOTE — Progress Notes (Signed)
Weight Loss Progress Note: Initial Physician Visit      Gregory Martinez is a 43 y.o. male with BMI   34.4   who is here for his Initial Evaluation for the medical bariatric care.      CC: I want to be healthier    Weight History  Current weight 253  Goal weight 210  Highest weight 253  (See weight gain time line scanned into media section of chart)    Food intolerances (gas, bloating, diarrhea, vomiting)? none      How many meals per day?3   Usual meal times?not on routine  Skipped meals?0    Which meals are skipped?    0       How often?      Who cooks/makes your meals? self    Who shops/buys your food? self    Weight loss History  How many weight loss attempts have you had?  Fad diets- adkins  Which program were you most successful doing?  0    Significant Medical History    Have you ever taken appetite suppressants? no   If yes: Rx or OTC?       If yes; Any negative side effects?    Ever diagnosed with sleep apnea or put on CPAP never tested, thinks he snores, not refreshed by sleep, frequent headahe- wakes with them    Ever had bariatric surgery: no    Pregnant or planning on becoming pregnant w/in 6 months: no        Significant Psychosocial History   Any history of drug abuse or dependence: no  Current Major Lifestyle Changes: no  Any potential unsupportive people: no  Why are you starting a weight loss program now?  Father is ill and this inspired change  Are you ready?  yes    History of binge eating disorder or anorexia : no   If yes, are you currently being treated no    Social History  Social History     Tobacco Use   ??? Smoking status: Never Smoker   ??? Smokeless tobacco: Never Used   Substance Use Topics   ??? Alcohol use: Not Currently     How many times a week do you eat out?  daily    Do you drink any EtOH?  Most weeks has some, not daily but about 2-3 days of the week     If so, how much?      Do you have upcoming any travel in the next 6 weeks?  no   If so, what do you have planned?        Exercise   How many days a week do you currently exercise?  3 -5 days  Have you ever been told by a physician not to exercise?  no      Objective  Visit Vitals  BP 135/84 (BP 1 Location: Right arm, BP Patient Position: Sitting)   Pulse 64   Temp 98.1 ??F (36.7 ??C) (Oral)   Resp 14   Ht 6' (1.829 m)   Wt 253 lb 14.4 oz (115.2 kg)   SpO2 95%   BMI 34.44 kg/m??       Waist Circumference: See Weight Management Doc Flowsheet  Neck Circumference: See Weight Management Doc Flowsheet  Percent Body Fat: See Weight Management Doc Flowsheet  Weight Metrics 03/27/2018 03/27/2018 03/01/2018   Weight - 253 lb 14.4 oz 259 lb 1.6 oz   Neck Circ (inches) 16.75 - -  Waist Measure Inches 44 - -   BMI - 34.44 kg/m2 35.14 kg/m2         Labs:     Review of Systems  Complete ROS negative except where noted above      Physical Exam    Vital Signs Reviewed  Weight Management Metrics Reviewed    Vital Signs Reviewed  Appearance: Obese, A&O x 3, NAD  HEENT:  NC/AT, EOMI, PERRL, No scleral icterus, malampatti score:  Skin:    Skin tags - no   Acanthosis Nigricans - no  Neck:  No nodes, thyromegaly no  Heart:  RRR without M/R/G  Lungs:  CTAB, no rhonchi, rales, or wheezes with good air exchange   Abdomen:  Non-tender, pos bowel sounds; hepatomegaly - no  Ext:  No C/C/E        Assessment & Plan  Diagnoses and all orders for this visit:    1. Mixed hyperlipidemia  Will be monitoring and treat as indicated  2. Obesity (BMI 30-39.9)  -     METABOLIC PANEL, COMPREHENSIVE; Future  Diet Plan: LCD low carb. Given written and verbal directions      Activity Plan: cont at least 4 days a week     Medication Plan none yet  3. Screening for diabetes mellitus  Treat as indicated. The exercise and this new diet will serve as lifestyle mode of teatment for cholesterol and blood sugar  4. Unrefreshed by sleep  -     SLEEP MEDICINE REFERRAL    5. Snoring  -     SLEEP MEDICINE REFERRAL    6. History of CVA (cerebrovascular accident)  stable  7. Screening for malnutrition   -     VITAMIN D, 25 HYDROXY; Future        Based on his history and exam, DIVIT HAAKE is a good candidate for the Non-[facility] Loss Program           Patient Instructions   LOW CAL DIET     Drink 64 ounces of water each day  Exercise at least 150 mins a week    Breakfast  Either a premier protein meal replacement( 30 g of protein) or 2 boiled eggs  And 1 piece of fruit( apple, orange, banana, grapefruit or pear)      Lunch  Either a premier protein drink (30 G ) or 3 ounces of lean meat and 2 servings of any leafy green vegetables. Can also have as 1 option for vegetables pinto beans or green peas.  AND 1 piece of fruit as listed above    Snack: premier protein drink    Dinner  4 ounces of lean meat with 2 servings of vegetables ( as listed above)  Also may have a small-medium baked sweet potato    For tea or coffee use stevia sweetener          Over 50% of the 30 minutes face to face time with Casimiro Needle consisted of counseling & coordinating and/or discussing treatment plans in reference to his obesity The primary encounter diagnosis was Mixed hyperlipidemia. Diagnoses of Obesity (BMI 30-39.9), Screening for diabetes mellitus, Unrefreshed by sleep, Snoring, History of CVA (cerebrovascular accident), and Screening for malnutrition were also pertinent to this visit.

## 2018-03-27 NOTE — Progress Notes (Signed)
Gregory Martinez is a 42 y.o. male    Chief Complaint   Patient presents with   ??? Weight Management     1. Have you been to the ER, urgent care clinic since your last visit?  Hospitalized since your last visit?No    2. Have you seen or consulted any other health care providers outside of the [facility] since your last visit?  Include any pap smears or colon screening. No     Body Weight: 253.9lb    BMI:  34.4    Body Fat: 29.6    Water %: 51.3

## 2018-03-27 NOTE — Progress Notes (Signed)
Weight Loss Progress Note: Initial Physician Visit      Gregory Martinez is a 43 y.o. male with BMI   34.4   who is here for his Initial Evaluation for the medical bariatric care.      CC: I want to be healthier    Weight History  Current weight 253  Goal weight 210  Highest weight 253  (See weight gain time line scanned into media section of chart)    Food intolerances (gas, bloating, diarrhea, vomiting)? none      How many meals per day?3   Usual meal times?not on routine  Skipped meals?0    Which meals are skipped?    0       How often?      Who cooks/makes your meals? self    Who shops/buys your food? self    Weight loss History  How many weight loss attempts have you had?  Fad diets- adkins  Which program were you most successful doing?  0    Significant Medical History    Have you ever taken appetite suppressants? no   If yes: Rx or OTC?       If yes; Any negative side effects?    Ever diagnosed with sleep apnea or put on CPAP never tested, thinks he snores, not refreshed by sleep, frequent headahe- wakes with them    Ever had bariatric surgery: no    Pregnant or planning on becoming pregnant w/in 6 months: no        Significant Psychosocial History   Any history of drug abuse or dependence: no  Current Major Lifestyle Changes: no  Any potential unsupportive people: no  Why are you starting a weight loss program now?  Father is ill and this inspired change  Are you ready?  yes    History of binge eating disorder or anorexia : no   If yes, are you currently being treated no    Social History  Social History     Tobacco Use   ??? Smoking status: Never Smoker   ??? Smokeless tobacco: Never Used   Substance Use Topics   ??? Alcohol use: Not Currently     How many times a week do you eat out?  daily    Do you drink any EtOH?  Most weeks has some, not daily but about 2-3 days of the week     If so, how much?      Do you have upcoming any travel in the next 6 weeks?  no   If so, what do you have planned?         Exercise  How many days a week do you currently exercise?  3 -5 days  Have you ever been told by a physician not to exercise?  no      Objective  Visit Vitals  BP 135/84 (BP 1 Location: Right arm, BP Patient Position: Sitting)   Pulse 64   Temp 98.1 ??F (36.7 ??C) (Oral)   Resp 14   Ht 6' (1.829 m)   Wt 253 lb 14.4 oz (115.2 kg)   SpO2 95%   BMI 34.44 kg/m??       Waist Circumference: See Weight Management Doc Flowsheet  Neck Circumference: See Weight Management Doc Flowsheet  Percent Body Fat: See Weight Management Doc Flowsheet  Weight Metrics 03/27/2018 03/27/2018 03/01/2018   Weight - 253 lb 14.4 oz 259 lb 1.6 oz   Neck Circ (inches) 16.75 - -  Waist Measure Inches 44 - -   BMI - 34.44 kg/m2 35.14 kg/m2         Labs:     Review of Systems  Complete ROS negative except where noted above      Physical Exam    Vital Signs Reviewed  Weight Management Metrics Reviewed    Vital Signs Reviewed  Appearance: Obese, A&O x 3, NAD  HEENT:  NC/AT, EOMI, PERRL, No scleral icterus, malampatti score:  Skin:    Skin tags - no   Acanthosis Nigricans - no  Neck:  No nodes, thyromegaly no  Heart:  RRR without M/R/G  Lungs:  CTAB, no rhonchi, rales, or wheezes with good air exchange   Abdomen:  Non-tender, pos bowel sounds; hepatomegaly - no  Ext:  No C/C/E        Assessment & Plan  Diagnoses and all orders for this visit:    1. Mixed hyperlipidemia  Will be monitoring and treat as indicated  2. Obesity (BMI 30-39.9)  -     METABOLIC PANEL, COMPREHENSIVE; Future  Diet Plan: LCD low carb. Given written and verbal directions      Activity Plan: cont at least 4 days a week     Medication Plan none yet  3. Screening for diabetes mellitus  Treat as indicated. The exercise and this new diet will serve as lifestyle mode of teatment for cholesterol and blood sugar  4. Unrefreshed by sleep  -     SLEEP MEDICINE REFERRAL    5. Snoring  -     SLEEP MEDICINE REFERRAL    6. History of CVA (cerebrovascular accident)  stable  7. Screening for  malnutrition  -     VITAMIN D, 25 HYDROXY; Future        Based on his history and exam, TKAI KILLEY is a good candidate for the [facility] Program           Patient Instructions   LOW CAL DIET     Drink 64 ounces of water each day  Exercise at least 150 mins a week    Breakfast  Either a premier protein meal replacement( 30 g of protein) or 2 boiled eggs  And 1 piece of fruit( apple, orange, banana, grapefruit or pear)      Lunch  Either a premier protein drink (30 G ) or 3 ounces of lean meat and 2 servings of any leafy green vegetables. Can also have as 1 option for vegetables pinto beans or green peas.  AND 1 piece of fruit as listed above    Snack: premier protein drink    Dinner  4 ounces of lean meat with 2 servings of vegetables ( as listed above)  Also may have a small-medium baked sweet potato    For tea or coffee use stevia sweetener          Over 50% of the 30 minutes face to face time with Casimiro Needle consisted of counseling & coordinating and/or discussing treatment plans in reference to his obesity The primary encounter diagnosis was Mixed hyperlipidemia. Diagnoses of Obesity (BMI 30-39.9), Screening for diabetes mellitus, Unrefreshed by sleep, Snoring, History of CVA (cerebrovascular accident), and Screening for malnutrition were also pertinent to this visit.

## 2018-03-27 NOTE — Progress Notes (Signed)
 Gregory Martinez is a 43 y.o. male    Chief Complaint   Patient presents with   . Weight Management     1. Have you been to the ER, urgent care clinic since your last visit?  Hospitalized since your last visit?No    2. Have you seen or consulted any other health care providers outside of the [facility] since your last visit?  Include any pap smears or colon screening. No     Body Weight: 253.9lb    BMI:  34.4    Body Fat: 29.6    Water %: 51.3

## 2018-04-02 ENCOUNTER — Encounter: Primary: Family Medicine

## 2018-04-22 ENCOUNTER — Ambulatory Visit: Attending: Family Medicine | Primary: Family Medicine

## 2018-04-22 ENCOUNTER — Ambulatory Visit
Admit: 2018-04-22 | Discharge: 2018-04-22 | Payer: PRIVATE HEALTH INSURANCE | Attending: Family Medicine | Primary: Family Medicine

## 2018-04-22 DIAGNOSIS — E782 Mixed hyperlipidemia: Secondary | ICD-10-CM

## 2018-04-22 NOTE — Progress Notes (Signed)
Weight Loss Progress Note: follow up Physician Visit      Gregory Martinez is a 43 y.o. male  who is here for his follow up  Evaluation for the medical bariatric care.      CC:   He typically does not eat a lot of meat  He usually eats more carbs  He is eating a lot of green vegetables  He has been away in [place] because he has a sick parent  He had blood drawn last Thursday, I do not have the results yet    Weight History  Current weight 245and BMI is Body mass index is 33.32 kg/m??.  Goal weight *210   Highest weight 253    (See weight gain time line scanned into media section of chart)        Significant Medical History    Update on sleep apnea and  CPAP suggested he get tested    Ever had bariatric surgery: no    Pregnant or planning on becoming pregnant w/in 6 months: no         Significant Psychosocial History     Current Major Lifestyle Changes: no  Any potential unsupportive people: no          Social History  Social History     Tobacco Use   ??? Smoking status: Never Smoker   ??? Smokeless tobacco: Never Used   Substance Use Topics   ??? Alcohol use: Not Currently     How many times a week do you eat out?  0    Do you drink any EtOH?  yes   If so, how much?  1 per week    Do you have upcoming any travel in the next 6 weeks?  yes   If so, what do you have planned?  2 weeks      Exercise  How many days a week do you currently exercise?  He does all resistance but tries to get his heart rate up, he does a little cycling  days  Have you ever been told by a physician not to exercise?  no      Objective  Visit Vitals  BP 105/71   Pulse 84   Temp 98.1 ??F (36.7 ??C)   Resp 18   Ht 6' (1.829 m)   Wt 245 lb 11.2 oz (111.4 kg)   SpO2 96%   BMI 33.32 kg/m??         Waist Circumference: See Weight Management Doc Flowsheet  Neck Circumference: See Weight Management Doc Flowsheet  Percent Body Fat: See Weight Management Doc Flowsheet  Weight Metrics 04/22/2018 04/22/2018 03/27/2018 03/27/2018 03/01/2018    Weight - 245 lb 11.2 oz - 253 lb 14.4 oz 259 lb 1.6 oz   Neck Circ (inches) 15.5 - 16.75 - -   Waist Measure Inches 43 - 44 - -   BMI - 33.32 kg/m2 - 34.44 kg/m2 35.14 kg/m2         Labs:     Review of Systems  Complete ROS negative except where noted above      Physical Exam    Vital Signs Reviewed  Weight Management Metrics Reviewed    Vital Signs Reviewed  Appearance: Obese, A&O x 3, NAD  HEENT:  NC/AT, EOMI, PERRL, No scleral icterus, malampatti score:  Skin:    Skin tags - no   Acanthosis Nigricans - no  Neck:  No nodes, thyromegaly   Heart:  RRR without M/R/G  Lungs:  CTAB, no rhonchi, rales, or wheezes with good air exchange   Abdomen:  Non-tender, pos bowel sounds; hepatomegaly -   Ext:  No C/C/E        Assessment & Plan  Diagnoses and all orders for this visit:    1. Mixed hyperlipidemia  Monitor and treat as indicated  2. Obesity (BMI 30-39.9)    Diet:cont LCD low carb    Activity: aim for a minimum 150 mins per week    Medication: none for appetite yet  3. Unrefreshed by sleep  Test for osa          Based on his history and exam, Gregory Martinez is a good candidate for the  [facility]     There are no Patient Instructions on file for this visit.    Follow-up and Dispositions    ?? Return in about 3 weeks (around 05/13/2018).         Over 50% of the 30 minutes face to face time with Gregory Martinez consisted of counseling & coordinating and/or discussing treatment plans in reference to his obesity The primary encounter diagnosis was Mixed hyperlipidemia. Diagnoses of Obesity (BMI 30-39.9) and Unrefreshed by sleep were also pertinent to this visit.  The patient verbalizes understanding and agrees with the plan of care    Patient has the advanced directives  booklet to review

## 2018-04-22 NOTE — Progress Notes (Signed)
Gregory Martinez is a 42 y.o. male , id x 2(name and DOB). Reviewed record, history, and  medications.    Chief Complaint   Patient presents with   ??? Weight Management     2 week follow up, would like to know if he is getting all of the nutrients that he needs from  his current diet.       Vitals:    04/22/18 0733   BP: 105/71   Pulse: 84   Resp: 18   Temp: 98.1 ??F (36.7 ??C)   SpO2: 96%   Weight: 245 lb 11.2 oz (111.4 kg)   Height: 6' (1.829 m)   PainSc:   0 - No pain     Body Weight: 245.7% LB  Body Fat%: 28.4 %  Muscle Mass Weight: -  Body Water Weight: 52.5 %  Basal Metabolic Rate: 2030  BMI: 33.3     Coordination of Care Questionnaire:   1) Have you been to an emergency room, urgent care, or hospitalized since your last visit?   no       2. Have seen or consulted any other health care provider since your last visit? NO      3 most recent PHQ Screens 04/22/2018   Little interest or pleasure in doing things Not at all   Feeling down, depressed, irritable, or hopeless Not at all   Total Score PHQ 2 0       Patient is accompanied by self I have received verbal consent from [name] to discuss any/all medical information while they are present in the room.

## 2018-04-22 NOTE — Progress Notes (Signed)
Weight Loss Progress Note: follow up Physician Visit      Gregory Martinez is a 43 y.o. male  who is here for his follow up  Evaluation for the medical bariatric care.      CC:   He typically does not eat a lot of meat  He usually eats more carbs  He is eating a lot of green vegetables  He has been away in [place] because he has a sick parent  He had blood drawn last Thursday, I do not have the results yet    Weight History  Current weight 245and BMI is Body mass index is 33.32 kg/m??.  Goal weight *210   Highest weight 253    (See weight gain time line scanned into media section of chart)        Significant Medical History    Update on sleep apnea and  CPAP suggested he get tested    Ever had bariatric surgery: no    Pregnant or planning on becoming pregnant w/in 6 months: no         Significant Psychosocial History     Current Major Lifestyle Changes: no  Any potential unsupportive people: no          Social History  Social History     Tobacco Use   ??? Smoking status: Never Smoker   ??? Smokeless tobacco: Never Used   Substance Use Topics   ??? Alcohol use: Not Currently     How many times a week do you eat out?  0    Do you drink any EtOH?  yes   If so, how much?  1 per week    Do you have upcoming any travel in the next 6 weeks?  yes   If so, what do you have planned?  2 weeks      Exercise  How many days a week do you currently exercise?  He does all resistance but tries to get his heart rate up, he does a little cycling  days  Have you ever been told by a physician not to exercise?  no      Objective  Visit Vitals  BP 105/71   Pulse 84   Temp 98.1 ??F (36.7 ??C)   Resp 18   Ht 6' (1.829 m)   Wt 245 lb 11.2 oz (111.4 kg)   SpO2 96%   BMI 33.32 kg/m??         Waist Circumference: See Weight Management Doc Flowsheet  Neck Circumference: See Weight Management Doc Flowsheet  Percent Body Fat: See Weight Management Doc Flowsheet  Weight Metrics 04/22/2018 04/22/2018 03/27/2018 03/27/2018 03/01/2018   Weight - 245 lb 11.2 oz - 253  lb 14.4 oz 259 lb 1.6 oz   Neck Circ (inches) 15.5 - 16.75 - -   Waist Measure Inches 43 - 44 - -   BMI - 33.32 kg/m2 - 34.44 kg/m2 35.14 kg/m2         Labs:     Review of Systems  Complete ROS negative except where noted above      Physical Exam    Vital Signs Reviewed  Weight Management Metrics Reviewed    Vital Signs Reviewed  Appearance: Obese, A&O x 3, NAD  HEENT:  NC/AT, EOMI, PERRL, No scleral icterus, malampatti score:  Skin:    Skin tags - no   Acanthosis Nigricans - no  Neck:  No nodes, thyromegaly   Heart:  RRR without M/R/G  Lungs:  CTAB, no rhonchi, rales, or wheezes with good air exchange   Abdomen:  Non-tender, pos bowel sounds; hepatomegaly -   Ext:  No C/C/E        Assessment & Plan  Diagnoses and all orders for this visit:    1. Mixed hyperlipidemia  Monitor and treat as indicated  2. Obesity (BMI 30-39.9)    Diet:cont LCD low carb    Activity: aim for a minimum 150 mins per week    Medication: none for appetite yet  3. Unrefreshed by sleep  Test for osa          Based on his history and exam, Gregory Martinez is a good candidate for the  [facility]     There are no Patient Instructions on file for this visit.    Follow-up and Dispositions    ?? Return in about 3 weeks (around 05/13/2018).         Over 50% of the 30 minutes face to face time with Gregory Martinez consisted of counseling & coordinating and/or discussing treatment plans in reference to his obesity The primary encounter diagnosis was Mixed hyperlipidemia. Diagnoses of Obesity (BMI 30-39.9) and Unrefreshed by sleep were also pertinent to this visit.  The patient verbalizes understanding and agrees with the plan of care    Patient has the advanced directives  booklet to review

## 2018-04-22 NOTE — Progress Notes (Signed)
BORNA FARHA is a 43 y.o. male , id x 2(name and DOB). Reviewed record, history, and  medications.    Chief Complaint   Patient presents with   . Weight Management     2 week follow up, would like to know if he is getting all of the nutrients that he needs from  his current diet.       Vitals:    04/22/18 0733   BP: 105/71   Pulse: 84   Resp: 18   Temp: 98.1 F (36.7 C)   SpO2: 96%   Weight: 245 lb 11.2 oz (111.4 kg)   Height: 6' (1.829 m)   PainSc:   0 - No pain     Body Weight: 245.7% LB  Body Fat%: 28.4 %  Muscle Mass Weight: -  Body Water Weight: 52.5 %  Basal Metabolic Rate: 2030  BMI: 33.3     Coordination of Care Questionnaire:   1) Have you been to an emergency room, urgent care, or hospitalized since your last visit?   no       2. Have seen or consulted any other health care provider since your last visit? NO      3 most recent PHQ Screens 04/22/2018   Little interest or pleasure in doing things Not at all   Feeling down, depressed, irritable, or hopeless Not at all   Total Score PHQ 2 0       Patient is accompanied by self I have received verbal consent from Dennison Mascot to discuss any/all medical information while they are present in the room.

## 2018-04-25 ENCOUNTER — Encounter: Attending: Specialist | Primary: Family Medicine

## 2018-05-13 NOTE — Telephone Encounter (Signed)
rec'd report from [facility] for sample submitted 04/18/18 for CMP and vit D level.     Electrolytes, liver, and kidney function are normal.     Vit D level is a little low, recommend over the counter supplement of 1000 international units daily.

## 2018-05-14 NOTE — Telephone Encounter (Signed)
Contacted pt to inform lab results per NP Tassell. No answer, left voicemail to return call.

## 2018-05-15 NOTE — Telephone Encounter (Signed)
2nd attempted made to pt to inform lab results. No answer, unable to leave message. pts mailbox is full.      Lab letter mailed to pt.

## 2018-05-16 ENCOUNTER — Encounter: Attending: Specialist | Primary: Family Medicine

## 2018-05-20 ENCOUNTER — Ambulatory Visit: Attending: Family Medicine | Primary: Family Medicine

## 2018-05-20 ENCOUNTER — Ambulatory Visit
Admit: 2018-05-20 | Discharge: 2018-05-20 | Payer: PRIVATE HEALTH INSURANCE | Attending: Family Medicine | Primary: Family Medicine

## 2018-05-20 DIAGNOSIS — E782 Mixed hyperlipidemia: Secondary | ICD-10-CM

## 2018-05-20 NOTE — Progress Notes (Signed)
Weight Loss Progress Note: follow up Physician Visit      Gregory Martinez is a 43 y.o. male  who is here for his follow up  Evaluation for the medical bariatric care.      CC:   He saw cardiology, he did lipids and a1c this month  He had a carotis artery doppler and was told there was a small amount of plaque  Weight History  Current weight 251 and BMI is Body mass index is 34.04 kg/m??.  Goal weight 210  Highest weight *253  (See weight gain time line scanned into media section of chart)    He is doing more cardio 4 days a week  He is eating mostly plant based  He eats beans most days  Not eating breads more than once a week    Significant Medical History    Update on sleep apnea and  CPAP not tested yet    Ever had bariatric surgery: no    Pregnant or planning on becoming pregnant w/in 6 months: no         Significant Psychosocial History     Current Major Lifestyle Changes: no  Any potential unsupportive people: no          Social History  Social History     Tobacco Use   ??? Smoking status: Never Smoker   ??? Smokeless tobacco: Never Used   Substance Use Topics   ??? Alcohol use: Not Currently     How many times a week do you eat out?  1    Do you drink any EtOH?  no   If so, how much?        Do you have upcoming any travel in the next 6 weeks?  no   If so, what do you have planned?          Exercise  How many days a week do you currently exercise?  4 days  Have you ever been told by a physician not to exercise?  no      Objective  Visit Vitals  BP 137/81 (BP 1 Location: Left arm, BP Patient Position: Sitting)   Pulse 68   Temp 98.2 ??F (36.8 ??C) (Oral)   Resp 16   Ht 6' (1.829 m)   Wt 251 lb (113.9 kg)   SpO2 97%   BMI 34.04 kg/m??         Waist Circumference: See Weight Management Doc Flowsheet  Neck Circumference: See Weight Management Doc Flowsheet  Percent Body Fat: See Weight Management Doc Flowsheet  Weight Metrics 05/20/2018 05/20/2018 04/22/2018 04/22/2018 03/27/2018 03/27/2018 03/01/2018    Weight - 251 lb - 245 lb 11.2 oz - 253 lb 14.4 oz 259 lb 1.6 oz   Neck Circ (inches) 15.5 - 15.5 - 16.75 - -   Waist Measure Inches 42.5 - 43 - 44 - -   BMI - 34.04 kg/m2 - 33.32 kg/m2 - 34.44 kg/m2 35.14 kg/m2         Labs:     Review of Systems  Complete ROS negative except where noted above      Physical Exam    Vital Signs Reviewed  Weight Management Metrics Reviewed    Vital Signs Reviewed  Appearance: Obese, A&O x 3, NAD  HEENT:  NC/AT, EOMI, PERRL, No scleral icterus, malampatti score:  Skin:    Skin tags - no   Acanthosis Nigricans -   Neck:  No nodes, thyromegaly   Heart:  RRR without M/R/G  Lungs:  CTAB, no rhonchi, rales, or wheezes with good air exchange   Abdomen:  Non-tender, pos bowel sounds; hepatomegaly -   Ext:  No C/C/E        Assessment & Plan  Diagnoses and all orders for this visit:    1. Mixed hyperlipidemia  Cont to monitor as the weight comes down, the cholesterol will likely  Come down too  2. Obesity (BMI 30-39.9)    Diet: cont same diet plan    Activity:cont the exercise but now has to do it at home use you tube to help get workouts    Medication: no changes    Based on his history and exam, JEYREN LYNDS is a good candidate for the  [facility]     There are no Patient Instructions on file for this visit.    Follow-up and Dispositions    ?? Return in about 1 month (around 06/20/2018).         Over 50% of the 30 minutes face to face time with Casimiro Needle consisted of counseling & coordinating and/or discussing treatment plans in reference to his obesity The primary encounter diagnosis was Mixed hyperlipidemia. A diagnosis of Obesity (BMI 30-39.9) was also pertinent to this visit.  The patient verbalizes understanding and agrees with the plan of care    Patient has the advanced directives  booklet to review

## 2018-05-20 NOTE — Progress Notes (Signed)
1. Have you been to the ER, urgent care clinic since your last visit?  Hospitalized since your last visit?No    2. Have you seen or consulted any other health care providers outside of the [facility] since your last visit?  Include any pap smears or colon screening. No     Chief Complaint   Patient presents with   ??? Cholesterol Problem     Visit Vitals  BP 137/81 (BP 1 Location: Left arm, BP Patient Position: Sitting)   Pulse 68   Temp 98.2 ??F (36.8 ??C) (Oral)   Resp 16   Ht 6' (1.829 m)   Wt 251 lb (113.9 kg)   SpO2 97%   BMI 34.04 kg/m??

## 2018-05-20 NOTE — Progress Notes (Signed)
Weight Loss Progress Note: follow up Physician Visit      Gregory Martinez is a 43 y.o. male  who is here for his follow up  Evaluation for the medical bariatric care.      CC:   He saw cardiology, he did lipids and a1c this month  He had a carotis artery doppler and was told there was a small amount of plaque  Weight History  Current weight 251 and BMI is Body mass index is 34.04 kg/m??.  Goal weight 210  Highest weight *253  (See weight gain time line scanned into media section of chart)    He is doing more cardio 4 days a week  He is eating mostly plant based  He eats beans most days  Not eating breads more than once a week    Significant Medical History    Update on sleep apnea and  CPAP not tested yet    Ever had bariatric surgery: no    Pregnant or planning on becoming pregnant w/in 6 months: no         Significant Psychosocial History     Current Major Lifestyle Changes: no  Any potential unsupportive people: no          Social History  Social History     Tobacco Use   ??? Smoking status: Never Smoker   ??? Smokeless tobacco: Never Used   Substance Use Topics   ??? Alcohol use: Not Currently     How many times a week do you eat out?  1    Do you drink any EtOH?  no   If so, how much?        Do you have upcoming any travel in the next 6 weeks?  no   If so, what do you have planned?          Exercise  How many days a week do you currently exercise?  4 days  Have you ever been told by a physician not to exercise?  no      Objective  Visit Vitals  BP 137/81 (BP 1 Location: Left arm, BP Patient Position: Sitting)   Pulse 68   Temp 98.2 ??F (36.8 ??C) (Oral)   Resp 16   Ht 6' (1.829 m)   Wt 251 lb (113.9 kg)   SpO2 97%   BMI 34.04 kg/m??         Waist Circumference: See Weight Management Doc Flowsheet  Neck Circumference: See Weight Management Doc Flowsheet  Percent Body Fat: See Weight Management Doc Flowsheet  Weight Metrics 05/20/2018 05/20/2018 04/22/2018 04/22/2018 03/27/2018 03/27/2018 03/01/2018   Weight - 251 lb - 245 lb  11.2 oz - 253 lb 14.4 oz 259 lb 1.6 oz   Neck Circ (inches) 15.5 - 15.5 - 16.75 - -   Waist Measure Inches 42.5 - 43 - 44 - -   BMI - 34.04 kg/m2 - 33.32 kg/m2 - 34.44 kg/m2 35.14 kg/m2         Labs:     Review of Systems  Complete ROS negative except where noted above      Physical Exam    Vital Signs Reviewed  Weight Management Metrics Reviewed    Vital Signs Reviewed  Appearance: Obese, A&O x 3, NAD  HEENT:  NC/AT, EOMI, PERRL, No scleral icterus, malampatti score:  Skin:    Skin tags - no   Acanthosis Nigricans -   Neck:  No nodes, thyromegaly   Heart:  RRR without M/R/G  Lungs:  CTAB, no rhonchi, rales, or wheezes with good air exchange   Abdomen:  Non-tender, pos bowel sounds; hepatomegaly -   Ext:  No C/C/E        Assessment & Plan  Diagnoses and all orders for this visit:    1. Mixed hyperlipidemia  Cont to monitor as the weight comes down, the cholesterol will likely  Come down too  2. Obesity (BMI 30-39.9)    Diet: cont same diet plan    Activity:cont the exercise but now has to do it at home use you tube to help get workouts    Medication: no changes    Based on his history and exam, Gregory Martinez is a good candidate for the  [facility]     There are no Patient Instructions on file for this visit.    Follow-up and Dispositions    ?? Return in about 1 month (around 06/20/2018).         Over 50% of the 30 minutes face to face time with Gregory Martinez consisted of counseling & coordinating and/or discussing treatment plans in reference to his obesity The primary encounter diagnosis was Mixed hyperlipidemia. A diagnosis of Obesity (BMI 30-39.9) was also pertinent to this visit.  The patient verbalizes understanding and agrees with the plan of care    Patient has the advanced directives  booklet to review

## 2018-05-20 NOTE — Progress Notes (Signed)
 1. Have you been to the ER, urgent care clinic since your last visit?  Hospitalized since your last visit?No    2. Have you seen or consulted any other health care providers outside of the [facility] since your last visit?  Include any pap smears or colon screening. No     Chief Complaint   Patient presents with   . Cholesterol Problem     Visit Vitals  BP 137/81 (BP 1 Location: Left arm, BP Patient Position: Sitting)   Pulse 68   Temp 98.2 F (36.8 C) (Oral)   Resp 16   Ht 6' (1.829 m)   Wt 251 lb (113.9 kg)   SpO2 97%   BMI 34.04 kg/m

## 2018-06-19 ENCOUNTER — Encounter: Attending: Family Medicine | Primary: Family Medicine

## 2019-06-07 ENCOUNTER — Emergency Department: Admit: 2019-06-08 | Payer: BLUE CROSS/BLUE SHIELD | Primary: Family Medicine

## 2019-06-07 DIAGNOSIS — I82402 Acute embolism and thrombosis of unspecified deep veins of left lower extremity: Principal | ICD-10-CM

## 2019-06-07 NOTE — ED Provider Notes (Signed)
EMERGENCY DEPARTMENT HISTORY AND PHYSICAL EXAM      Date: 06/07/2019  Patient Name: Gregory Martinez      History of Presenting Illness     Chief Complaint   Patient presents with   ??? Hypertension       History Provided By: Patient    HPI: Gregory Martinez, 44 y.o. male with a past medical history significant hypertension, hyperlipidemia and stroke presents to the ED with cc of left leg pain, elevated blood pressure, and mild shortness of breath.  Patient stated he has been having left leg pain for almost 6 weeks.  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  He also complains of elevated blood pressure today.  He thinks he probably was nervous.  He also stated he cannot catch his breath.  He is not sure if he is anxious or something else is wrong with him.  Patient denies chest pain.  Patient denies weakness or numbness.  No blurred vision.  At the this time patient has stated headache is constant she took some Tylenol at home.  No other complaints at this time.    There are no other complaints, changes, or physical findings at this time.    PCP: Buel Ream, MD        Past History     Past Medical History:  Past Medical History:   Diagnosis Date   ??? 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 DVT (deep venous thrombosis) ([place])        Past Surgical History:  Past Surgical History:   Procedure Laterality Date   ??? PR CARDIAC SURG PROCEDURE UNLIST      PDO       Family History:  Family History   Problem Relation Age of Onset   ??? Cancer Father    ??? Heart Disease Father    ??? Heart Disease Maternal Aunt    ??? Migraines Paternal Aunt    ??? Heart Disease Maternal Grandmother        Social History:  Social History     Tobacco Use   ??? Smoking status: Never Smoker   ??? Smokeless tobacco: Never Used   Substance Use Topics   ??? Alcohol use: Yes     Comment: weekends   ??? Drug use: Never       Allergies:  No Known Allergies      Review of Systems     Review of Systems   Constitutional: Negative.    HENT: Negative.     Eyes: Negative.    Respiratory: Positive for shortness of breath.    Cardiovascular: Negative.    Gastrointestinal: Positive for abdominal distention.   Endocrine: Negative.    Genitourinary: Negative.    Musculoskeletal: Negative.    Allergic/Immunologic: Negative.    Neurological: Negative.    Hematological: Negative.    Psychiatric/Behavioral: Negative.    All other systems reviewed and are negative.      Physical Exam     Physical Exam  Vitals signs and nursing note reviewed.   Constitutional:       General: He is not in acute distress.     Appearance: Normal appearance. He is normal weight. He is not ill-appearing, toxic-appearing or diaphoretic.   HENT:      Head: Normocephalic and atraumatic.      Nose: Nose normal. No congestion or rhinorrhea.      Mouth/Throat:      Pharynx: No oropharyngeal  exudate or posterior oropharyngeal erythema.   Eyes:      General: No scleral icterus.        Right eye: No discharge.         Left eye: No discharge.      Extraocular Movements: Extraocular movements intact.      Conjunctiva/sclera: Conjunctivae normal.      Pupils: Pupils are equal, round, and reactive to light.   Neck:      Musculoskeletal: Normal range of motion and neck supple. No neck rigidity or muscular tenderness.   Cardiovascular:      Rate and Rhythm: Normal rate and regular rhythm.      Pulses: Normal pulses.      Heart sounds: Normal heart sounds.   Pulmonary:      Effort: Pulmonary effort is normal. No respiratory distress.      Breath sounds: Normal breath sounds. No stridor. No wheezing or rhonchi.   Abdominal:      General: Abdomen is flat. Bowel sounds are normal. There is no distension.      Palpations: Abdomen is soft.      Tenderness: There is no abdominal tenderness. There is no right CVA tenderness, left CVA tenderness, guarding or rebound.   Musculoskeletal: Normal range of motion.         General: Tenderness present. No swelling, deformity or signs of injury.      Right lower leg: No edema.       Left lower leg: No edema.      Comments: Very mild tenderness in the back of the patient's left knee.   Skin:     General: Skin is warm and dry.      Coloration: Skin is not jaundiced or pale.      Findings: No bruising, erythema, lesion or rash.   Neurological:      General: No focal deficit present.      Mental Status: He is alert and oriented to person, place, and time.      Cranial Nerves: No cranial nerve deficit.      Sensory: No sensory deficit.      Motor: No weakness.      Coordination: Coordination normal.   Psychiatric:         Thought Content: Thought content normal.         Judgment: Judgment normal.      Comments: Patient is anxious and mildly depressed         Lab and Diagnostic Study Results     Labs -     Recent Results (from the past 12 hour(s))   CBC WITH AUTOMATED DIFF    Collection Time: 06/07/19 11:15 PM   Result Value Ref Range    WBC 4.6 4.1 - 11.1 K/uL    RBC 4.94 4.10 - 5.70 M/uL    HGB 14.4 12.1 - 17.0 g/dL    HCT 45.6 36.6 - 50.3 %    MCV 92.3 80.0 - 99.0 FL    MCH 29.1 26.0 - 34.0 PG    MCHC 31.6 30.0 - 36.5 g/dL    RDW 13.6 11.5 - 14.5 %    PLATELET 151 150 - 400 K/uL    MPV 11.1 8.9 - 12.9 FL    NEUTROPHILS 59 32 - 75 %    LYMPHOCYTES 28 12 - 49 %    MONOCYTES 9 5 - 13 %    EOSINOPHILS 4 0 - 7 %    BASOPHILS 0 0 -  1 %    IMMATURE GRANULOCYTES 0 0.0 - 0.5 %    ABS. NEUTROPHILS 2.7 1.8 - 8.0 K/UL    ABS. LYMPHOCYTES 1.3 0.8 - 3.5 K/UL    ABS. MONOCYTES 0.4 0.0 - 1.0 K/UL    ABS. EOSINOPHILS 0.2 0.0 - 0.4 K/UL    ABS. BASOPHILS 0.0 0.0 - 0.1 K/UL    ABS. IMM. GRANS. 0.0 0.00 - 0.04 K/UL    DF AUTOMATED     METABOLIC PANEL, COMPREHENSIVE    Collection Time: 06/07/19 11:15 PM   Result Value Ref Range    Sodium 139 136 - 145 mmol/L    Potassium 3.5 3.5 - 5.1 mmol/L    Chloride 107 97 - 108 mmol/L    CO2 28 21 - 32 mmol/L    Anion gap 4 (L) 5 - 15 mmol/L    Glucose 154 (H) 65 - 100 mg/dL    BUN 15 6 - 20 mg/dL    Creatinine 1.07 0.70 - 1.30 mg/dL    BUN/Creatinine ratio 14 12 - 20      GFR  est AA >60 >60 ml/min/1.10m    GFR est non-AA >60 >60 ml/min/1.741m   Calcium 8.6 8.5 - 10.1 mg/dL    Bilirubin, total 0.3 0.2 - 1.0 mg/dL    AST (SGOT) 29 15 - 37 U/L    ALT (SGPT) 42 12 - 78 U/L    Alk. phosphatase 84 45 - 117 U/L    Protein, total 7.0 6.4 - 8.2 g/dL    Albumin 3.4 (L) 3.5 - 5.0 g/dL    Globulin 3.6 2.0 - 4.0 g/dL    A-G Ratio 0.9 (L) 1.1 - 2.2     TROPONIN I    Collection Time: 06/07/19 11:15 PM   Result Value Ref Range    Troponin-I, Qt. <0.05 <0.05 ng/mL       Radiologic Studies -   _0 @  CT Results  (Last 48 hours)               06/08/19 0044  CTA CHEST W OR W WO CONT Final result    Impression:  Right lower lobe pulmonary emboli.       Report called to Dr. KaElly Modena:10 AM.       Narrative:  Indication: Shortness of breath. History of DVT.       Dose reduction: All CT scans done at this facility are performed using dose   reduction optimization techniques as appropriate to a performed exam including   the following: Automated exposure control, adjustments of the mA and/or kV   according to patient size, or use of iterative reconstruction technique.       CTA chest, PE protocol, 100 cc Isovue-370 IV, multiplanar standard and MIP   reformatting 06/08/2019. Correlation chest radiograph the same day.       Small emboli in right lower lobe pulmonary arteries. No right heart strain.   Normal thoracic aorta.   No pulmonary infiltrate.   Small hepatic cysts.   Partly included left renal cysts.               CXR Results  (Last 48 hours)               06/07/19 2235  XR CHEST SNGL V Final result    Impression:      No acute finding.               Narrative:  Indication: Shortness  of breath.       AP semiupright portable chest radiograph 2229 hours 07 June 2019. No   comparison.       Clear lungs.   Normal heart size.   No pleural effusion or pneumothorax.                 Medical Decision Making and ED Course   - I am the first and primary provider for this patient AND AM THE PRIMARY  PROVIDER OF RECORD.    - I reviewed the vital signs, available nursing notes, past medical history, past surgical history, family history and social history.    - Initial assessment performed. The patients presenting problems have been discussed, and the staff are in agreement with the care plan formulated and outlined with them.  I have encouraged them to ask questions as they arise throughout their visit.    Vital Signs-Reviewed the patient's vital signs.    Patient Vitals for the past 12 hrs:   Temp Pulse Resp BP SpO2   06/08/19 0050 ??? 63 16 (!) 154/92 95 %   06/07/19 2238 ??? 68 14 (!) 159/97 98 %   06/07/19 2208 98 ??F (36.7 ??C) 76 16 (!) 147/90 100 %       EKG interpretation: (Preliminary): EKG was done at 10:12 PM.  Normal sinus rhythm.  Rate of 80.  Left axis deviation.  Questionable LVH.  No acute ST-T elevation.  No STEMI.  No old EKG available for comparison.      Records Reviewed: Nursing Notes        ED Course:              Provider Notes (Medical Decision Making):     MDM  Number of Diagnoses or Management Options  Acute pulmonary embolism without acute cor pulmonale, unspecified pulmonary embolism type [facility]): new, needed workup  Diagnosis management comments: Patient diagnoses include hypertension, hypertensive emergency, hypertensive emergency, noncompliance with medicine, electrolyte abnormality, cardiac event, PE.       Amount and/or Complexity of Data Reviewed  Clinical lab tests: reviewed and ordered  Tests in the radiology section of CPT??: ordered and reviewed  Tests in the medicine section of CPT??: ordered and reviewed  Discuss the patient with other providers: yes (Case discussed with Dr. Diona Foley, the admitting physician.  She accepted the patient's.)  Independent visualization of images, tracings, or specimens: yes (EKG was done at 10:12 PM.  Normal sinus rhythm.  Rate of 80.  Left axis deviation.  Questionable LVH.  No acute ST-T elevation.  No STEMI.  No old EKG available for comparison.)    Risk  of Complications, Morbidity, and/or Mortality  Presenting problems: high  Diagnostic procedures: high  Management options: high  General comments: Patient remained stable throughout the course of treatment.  Labs were essentially unremarkable.  CAT scan of the chest was positive for PE according to the radiologist.  Heparin was ordered.  Case discussed with admitting physician.  Will admit to her service.               Consultations:       Consultations:         Procedures and Critical Care       Performed by: Neville Route, DO  PROCEDURES:  Procedures               CRITICAL CARE NOTE :  10:27 PM  Amount of Critical Care Time: (minutes)    IMPENDING DETERIORATION -  ASSOCIATED RISK FACTORS -   MANAGEMENT-   INTERPRETATION -    INTERVENTIONS -   CASE REVIEW -   TREATMENT RESPONSE -  PERFORMED BY -     NOTES   :  I have spent critical care time involved in lab review, consultations with specialist, family decision- making, bedside attention and documentation. This time excludes time spent in any separate billed procedures.  During this entire length of time I was immediately available to the patient .    Neville Route, DO        Disposition     Disposition: Condition stable and improved    Admitted    Remove if not discharged  DISCHARGE PLAN:  1. There are no discharge medications for this patient.    2.   Follow-up Information    None       3.  Return to ED if worse   4. There are no discharge medications for this patient.      Diagnosis     Clinical Impression:   1. Acute pulmonary embolism without acute cor pulmonale, unspecified pulmonary embolism type ([place])    2. History of CVA (cerebrovascular accident)    3. History of DVT (deep vein thrombosis)        Attestations:    Neville Route, DO    Please note that this dictation was completed with Dragon, the computer voice recognition software.  Quite often unanticipated grammatical, syntax, homophones, and other interpretive errors are  inadvertently transcribed by the computer software.  Please disregard these errors.  Please excuse any errors that have escaped final proofreading.  Thank you.

## 2019-06-07 NOTE — ED Triage Notes (Signed)
Pt states BP running high. bp at home 138/94 hr was 81 Pt has had a blood clot in left leg then had a stroke from the clot.

## 2019-06-07 NOTE — ED Provider Notes (Signed)
ED Provider Notes by Neville Route, DO at 06/07/19 2227                Author: Neville Route, DO  Service: EMERGENCY  Author Type: Physician       Filed: 06/08/19 0227  Date of Service: 06/07/19 2227  Status: Signed          Editor: Neville Route, DO (Physician)               EMERGENCY DEPARTMENT HISTORY AND PHYSICAL EXAM           Date: 06/07/2019   Patient Name: Gregory Martinez           History of Presenting Illness          Chief Complaint       Patient presents with        ?  Hypertension           History Provided By: Patient      HPI: Gregory Martinez,  44 y.o. male with a past medical history significant  hypertension, hyperlipidemia and stroke presents to the ED with cc of left leg pain, elevated blood pressure, and mild shortness of breath.  Patient stated he has been having left leg pain for almost  6 weeks.  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  He also complains of elevated blood pressure today.  He thinks he probably was nervous.  He also stated he cannot catch his breath.  He is not sure if he is anxious or something else is wrong with him.  Patient  denies chest pain.  Patient denies weakness or numbness.  No blurred vision.  At the this time patient has stated headache is constant she took some Tylenol at home.  No other complaints at this time.      There are no other complaints, changes, or physical findings at this time.      PCP: Buel Ream, MD              Past History        Past Medical History:     Past Medical History:        Diagnosis  Date         ?  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  DVT (deep venous thrombosis) ([place])             Past Surgical History:     Past Surgical History:         Procedure  Laterality  Date          ?  PR CARDIAC SURG PROCEDURE UNLIST              PDO           Family History:     Family History         Problem  Relation  Age of Onset          ?  Cancer  Father       ?  Heart Disease   Father       ?  Heart Disease  Maternal Aunt       ?  Migraines  Paternal Aunt            ?  Heart Disease  Maternal Grandmother             Social History:  Social History          Tobacco Use         ?  Smoking status:  Never Smoker     ?  Smokeless tobacco:  Never Used       Substance Use Topics         ?  Alcohol use:  Yes             Comment: weekends         ?  Drug use:  Never           Allergies:   No Known Allergies           Review of Systems        Review of Systems    Constitutional: Negative.     HENT: Negative.     Eyes: Negative.     Respiratory: Positive for shortness of breath.     Cardiovascular: Negative.     Gastrointestinal: Positive for abdominal distention.    Endocrine: Negative.     Genitourinary: Negative.     Musculoskeletal: Negative.     Allergic/Immunologic: Negative.     Neurological: Negative.     Hematological: Negative.     Psychiatric/Behavioral: Negative.     All other systems reviewed and are negative.           Physical Exam        Physical Exam   Vitals signs and nursing note reviewed.   Constitutional:        General: He is not in acute distress.     Appearance: Normal appearance. He is normal weight. He is not ill-appearing, toxic-appearing or diaphoretic.   HENT:       Head: Normocephalic and atraumatic.      Nose: Nose normal. No congestion or rhinorrhea.      Mouth/Throat:      Pharynx: No oropharyngeal exudate or posterior oropharyngeal erythema.   Eyes:       General: No scleral icterus.        Right eye: No discharge.         Left eye: No discharge.      Extraocular Movements: Extraocular movements intact.      Conjunctiva/sclera: Conjunctivae normal.      Pupils: Pupils are equal,  round, and reactive to light.   Neck :       Musculoskeletal: Normal range of motion and neck supple. No neck rigidity or muscular tenderness.    Cardiovascular:       Rate and Rhythm: Normal rate and regular rhythm.      Pulses: Normal pulses.      Heart sounds: Normal heart sounds.     Pulmonary:       Effort: Pulmonary effort is normal. No respiratory distress.      Breath sounds: Normal breath sounds. No stridor. No wheezing or  rhonchi.    Abdominal:      General: Abdomen is flat. Bowel sounds are normal. There is no distension.      Palpations: Abdomen is soft.      Tenderness: There is no abdominal tenderness. There  is no right CVA tenderness, left CVA tenderness, guarding or rebound.     Musculoskeletal: Normal range of motion.          General: Tenderness present. No swelling, deformity or signs of injury.      Right lower leg: No edema.      Left lower leg: No  edema.  Comments: Very mild tenderness in the back of the patient's left knee.    Skin:      General: Skin is warm and dry.      Coloration: Skin is not jaundiced or pale.      Findings: No bruising, erythema, lesion or rash.   Neurological:       General: No focal deficit present.      Mental Status: He is alert and oriented to person, place, and time.      Cranial Nerves: No cranial nerve deficit.      Sensory: No sensory deficit.      Motor: No weakness.      Coordination:  Coordination normal.   Psychiatric :         Thought Content: Thought content normal.         Judgment: Judgment normal.      Comments: Patient is anxious and mildly depressed                Lab and Diagnostic Study Results        Labs -         Recent Results (from the past 12 hour(s))     CBC WITH AUTOMATED DIFF          Collection Time: 06/07/19 11:15 PM         Result  Value  Ref Range            WBC  4.6  4.1 - 11.1 K/uL            RBC  4.94  4.10 - 5.70 M/uL            HGB  14.4  12.1 - 17.0 g/dL       HCT  45.6  36.6 - 50.3 %       MCV  92.3  80.0 - 99.0 FL       MCH  29.1  26.0 - 34.0 PG       MCHC  31.6  30.0 - 36.5 g/dL       RDW  13.6  11.5 - 14.5 %       PLATELET  151  150 - 400 K/uL       MPV  11.1  8.9 - 12.9 FL       NEUTROPHILS  59  32 - 75 %       LYMPHOCYTES  28  12 - 49 %       MONOCYTES  9  5 - 13 %       EOSINOPHILS  4  0 - 7 %        BASOPHILS  0  0 - 1 %       IMMATURE GRANULOCYTES  0  0.0 - 0.5 %       ABS. NEUTROPHILS  2.7  1.8 - 8.0 K/UL       ABS. LYMPHOCYTES  1.3  0.8 - 3.5 K/UL       ABS. MONOCYTES  0.4  0.0 - 1.0 K/UL       ABS. EOSINOPHILS  0.2  0.0 - 0.4 K/UL       ABS. BASOPHILS  0.0  0.0 - 0.1 K/UL       ABS. IMM. GRANS.  0.0  0.00 - 0.04 K/UL       DF  AUTOMATED          METABOLIC PANEL, COMPREHENSIVE  Collection Time: 06/07/19 11:15 PM         Result  Value  Ref Range            Sodium  139  136 - 145 mmol/L       Potassium  3.5  3.5 - 5.1 mmol/L       Chloride  107  97 - 108 mmol/L       CO2  28  21 - 32 mmol/L       Anion gap  4 (L)  5 - 15 mmol/L       Glucose  154 (H)  65 - 100 mg/dL       BUN  15  6 - 20 mg/dL       Creatinine  1.07  0.70 - 1.30 mg/dL       BUN/Creatinine ratio  14  12 - 20              GFR est AA  >60  >60 ml/min/1.68m            GFR est non-AA  >60  >60 ml/min/1.721m      Calcium  8.6  8.5 - 10.1 mg/dL       Bilirubin, total  0.3  0.2 - 1.0 mg/dL       AST (SGOT)  29  15 - 37 U/L       ALT (SGPT)  42  12 - 78 U/L       Alk. phosphatase  84  45 - 117 U/L       Protein, total  7.0  6.4 - 8.2 g/dL       Albumin  3.4 (L)  3.5 - 5.0 g/dL       Globulin  3.6  2.0 - 4.0 g/dL       A-G Ratio  0.9 (L)  1.1 - 2.2         TROPONIN I          Collection Time: 06/07/19 11:15 PM         Result  Value  Ref Range            Troponin-I, Qt.  <0.05  <0.05 ng/mL           Radiologic Studies -    _0 @     CT Results   (Last 48 hours)                                    06/08/19 0044    CTA CHEST W OR W WO CONT  Final result            Impression:    Right lower lobe pulmonary emboli.             Report called to Dr. KaElly Modena:10 AM.                       Narrative:    Indication: Shortness of breath. History of DVT.             Dose reduction: All CT scans done at this facility are performed using dose      reduction optimization techniques as appropriate to a performed exam including      the following:  Automated exposure control, adjustments of the mA and/or kV      according to patient size, or use  of iterative reconstruction technique.             CTA chest, PE protocol, 100 cc Isovue-370 IV, multiplanar standard and MIP      reformatting 06/08/2019. Correlation chest radiograph the same day.             Small emboli in right lower lobe pulmonary arteries. No right heart strain.      Normal thoracic aorta.      No pulmonary infiltrate.      Small hepatic cysts.      Partly included left renal cysts.                                 CXR Results   (Last 48 hours)                                    06/07/19 2235    XR CHEST SNGL V  Final result            Impression:           No acute finding.                                     Narrative:    Indication: Shortness of breath.             AP semiupright portable chest radiograph 2229 hours 07 June 2019. No      comparison.             Clear lungs.      Normal heart size.      No pleural effusion or pneumothorax.                                    Medical Decision Making and ED Course     - I am the first and primary provider for this patient AND AM THE PRIMARY PROVIDER OF RECORD.      - I reviewed the vital signs, available nursing notes, past medical history, past surgical history, family history and social history.      - Initial assessment performed. The patients presenting problems have been discussed, and the staff are in agreement with the care plan formulated and outlined with them.  I have encouraged them to ask questions as they arise throughout their visit.      Vital Signs-Reviewed the patient's vital signs.      Patient Vitals for the past 12 hrs:            Temp  Pulse  Resp  BP  SpO2            06/08/19 0050  --  63  16  (!) 154/92  95 %            06/07/19 2238  --  68  14  (!) 159/97  98 %            06/07/19 2208  98 ??F (36.7 ??C)  76  16  (!) 147/90  100 %           EKG interpretation: (Preliminary): EKG was done at 10:12 PM.  Normal sinus rhythm.   Rate of 80.  Left axis  deviation.  Questionable LVH.   No acute ST-T elevation.  No STEMI.  No old EKG available for comparison.         Records Reviewed: Nursing Notes            ED Course:                    Provider Notes (Medical Decision Making):       MDM   Number of Diagnoses or Management Options   Acute pulmonary embolism without acute cor pulmonale, unspecified pulmonary embolism type [facility]): new, needed workup   Diagnosis management comments: Patient diagnoses include hypertension, hypertensive emergency, hypertensive emergency, noncompliance with medicine, electrolyte abnormality, cardiac event, PE.          Amount and/or Complexity of Data Reviewed   Clinical lab tests: reviewed and ordered   Tests in the radiology section of CPT??: ordered and reviewed   Tests in the medicine section of CPT??: ordered and reviewed   Discuss the patient with other providers: yes (Case discussed  with Dr. Diona Foley, the admitting physician.  She accepted the patient's.)   Independent visualization of images, tracings, or specimens: yes  (EKG was done at 10:12 PM.  Normal sinus rhythm.  Rate of 80.  Left axis deviation.  Questionable LVH.  No acute ST-T elevation.  No STEMI.  No old EKG available for comparison.)      Risk of Complications, Morbidity, and/or Mortality   Presenting problems: high  Diagnostic procedures: high  Management options: high  General  comments: Patient remained stable throughout the course of treatment.  Labs were essentially unremarkable.  CAT scan of the chest was positive for PE according to the radiologist.  Heparin was ordered.  Case discussed with admitting physician.  Will admit  to her service.                        Consultations:           Consultations:               Procedures and Critical Care           Performed by: Neville Route, DO   PROCEDURES:   Procedures                      CRITICAL CARE NOTE :   10:27 PM   Amount of Critical Care Time: ( minutes)      IMPENDING DETERIORATION  -   ASSOCIATED RISK FACTORS -    MANAGEMENT-    INTERPRETATION -     INTERVENTIONS -    CASE REVIEW -    TREATMENT RESPONSE -   PERFORMED BY -       NOTES   :   I have spent critical care time involved in lab review, consultations with specialist, family decision- making, bedside attention and documentation. This time excludes time spent in any separate billed  procedures.  During this entire length of time I was immediately available to the patient .      Neville Route, DO              Disposition        Disposition: Condition stable and improved      Admitted      Remove if not discharged   DISCHARGE PLAN:   1. There are no discharge medications for this patient.      2.  Follow-up Information      None             3.  Return to ED if worse    4. There are no discharge medications for this patient.           Diagnosis        Clinical Impression:       1.  Acute pulmonary embolism without acute cor pulmonale, unspecified pulmonary embolism type ([place])      2.  History of CVA (cerebrovascular accident)         3.  History of DVT (deep vein thrombosis)            Attestations:      Neville Route, DO      Please note that this dictation was completed with Dragon, the computer voice recognition software.  Quite often unanticipated grammatical, syntax, homophones, and other interpretive errors are inadvertently  transcribed by the computer software.  Please disregard these errors.  Please excuse any errors that have escaped final proofreading.  Thank you.

## 2019-06-07 NOTE — ED Notes (Signed)
Pt states BP running high. bp at home 138/94 hr was 81 Pt has had a blood clot in left leg then had a stroke from the clot.

## 2019-06-08 ENCOUNTER — Inpatient Hospital Stay
Admit: 2019-06-08 | Discharge: 2019-06-09 | Disposition: A | Discharge status: Home / Self Care | Payer: BLUE CROSS/BLUE SHIELD | Attending: Adolescent Medicine | Admitting: Adolescent Medicine

## 2019-06-08 ENCOUNTER — Inpatient Hospital Stay: Admit: 2019-06-08 | Payer: BLUE CROSS/BLUE SHIELD | Primary: Family Medicine

## 2019-06-08 LAB — CBC WITH AUTO DIFFERENTIAL
Basophils %: 0 % (ref 0–1)
Basophils %: 0 % (ref 0–1)
Basophils Absolute: 0 10*3/uL (ref 0.0–0.1)
Basophils Absolute: 0 10*3/uL (ref 0.0–0.1)
Eosinophils %: 4 % (ref 0–7)
Eosinophils %: 5 % (ref 0–7)
Eosinophils Absolute: 0.2 10*3/uL (ref 0.0–0.4)
Eosinophils Absolute: 0.2 10*3/uL (ref 0.0–0.4)
Granulocyte Absolute Count: 0 10*3/uL (ref 0.00–0.04)
Granulocyte Absolute Count: 0 10*3/uL (ref 0.00–0.04)
Hematocrit: 45.6 % (ref 36.6–50.3)
Hematocrit: 45.4 % (ref 36.6–50.3)
Hemoglobin: 14.4 g/dL (ref 12.1–17.0)
Hemoglobin: 14.1 g/dL (ref 12.1–17.0)
Immature Granulocytes: 0 % (ref 0.0–0.5)
Immature Granulocytes: 0 % (ref 0.0–0.5)
Lymphocytes %: 28 % (ref 12–49)
Lymphocytes %: 35 % (ref 12–49)
Lymphocytes Absolute: 1.3 10*3/uL (ref 0.8–3.5)
Lymphocytes Absolute: 1.3 10*3/uL (ref 0.8–3.5)
MCH: 29.1 PG (ref 26.0–34.0)
MCH: 29 PG (ref 26.0–34.0)
MCHC: 31.6 g/dL (ref 30.0–36.5)
MCHC: 31.1 g/dL (ref 30.0–36.5)
MCV: 92.3 FL (ref 80.0–99.0)
MCV: 93.4 FL (ref 80.0–99.0)
MPV: 11.1 FL (ref 8.9–12.9)
MPV: 11.4 FL (ref 8.9–12.9)
Monocytes %: 9 % (ref 5–13)
Monocytes %: 7 % (ref 5–13)
Monocytes Absolute: 0.4 10*3/uL (ref 0.0–1.0)
Monocytes Absolute: 0.3 10*3/uL (ref 0.0–1.0)
Neutrophils %: 59 % (ref 32–75)
Neutrophils %: 53 % (ref 32–75)
Neutrophils Absolute: 2.7 10*3/uL (ref 1.8–8.0)
Neutrophils Absolute: 2 10*3/uL (ref 1.8–8.0)
Platelets: 151 10*3/uL (ref 150–400)
Platelets: 152 10*3/uL (ref 150–400)
RBC: 4.94 M/uL (ref 4.10–5.70)
RBC: 4.86 M/uL (ref 4.10–5.70)
RDW: 13.6 % (ref 11.5–14.5)
RDW: 13.6 % (ref 11.5–14.5)
WBC: 4.6 10*3/uL (ref 4.1–11.1)
WBC: 3.8 10*3/uL — ABNORMAL LOW (ref 4.1–11.1)

## 2019-06-08 LAB — MRSA SCREEN - PCR (NASAL)
MRSA By PCR (Nasal): NOT DETECTED
MRSA by PCR, Nasal: NOT DETECTED

## 2019-06-08 LAB — COMPREHENSIVE METABOLIC PANEL
ALT: 42 U/L (ref 12–78)
AST: 29 U/L (ref 15–37)
Albumin/Globulin Ratio: 0.9 — ABNORMAL LOW (ref 1.1–2.2)
Albumin: 3.4 g/dL — ABNORMAL LOW (ref 3.5–5.0)
Alkaline Phosphatase: 84 U/L (ref 45–117)
Anion Gap: 4 mmol/L — ABNORMAL LOW (ref 5–15)
BUN: 15 mg/dL (ref 6–20)
Bun/Cre Ratio: 14 (ref 12–20)
CO2: 28 mmol/L (ref 21–32)
Calcium: 8.6 mg/dL (ref 8.5–10.1)
Chloride: 107 mmol/L (ref 97–108)
Creatinine: 1.07 mg/dL (ref 0.70–1.30)
EGFR IF NonAfrican American: >60 mL/min/{1.73_m2} (ref >60)
GFR African American: >60 mL/min/{1.73_m2} (ref >60)
Globulin: 3.6 g/dL (ref 2.0–4.0)
Glucose: 154 mg/dL — ABNORMAL HIGH (ref 65–100)
Potassium: 3.5 mmol/L (ref 3.5–5.1)
Sodium: 139 mmol/L (ref 136–145)
Total Bilirubin: 0.3 mg/dL (ref 0.2–1.0)
Total Protein: 7 g/dL (ref 6.4–8.2)

## 2019-06-08 LAB — TROPONIN
Troponin I: <0.05 ng/mL (ref <0.05)
Troponin I: <0.05 ng/mL (ref <0.05)

## 2019-06-08 LAB — APTT
aPTT: 32 s (ref 23.0–35.7)
aPTT: 102.3 s — ABNORMAL HIGH (ref 23.0–35.7)
aPTT: 108.3 s — ABNORMAL HIGH (ref 23.0–35.7)

## 2019-06-08 LAB — CBC WITH AUTOMATED DIFF
ABS. BASOPHILS: 0 10*3/uL (ref 0.0–0.1)
ABS. BASOPHILS: 0 10*3/uL (ref 0.0–0.1)
ABS. EOSINOPHILS: 0.2 10*3/uL (ref 0.0–0.4)
ABS. EOSINOPHILS: 0.2 10*3/uL (ref 0.0–0.4)
ABS. IMM. GRANS.: 0 10*3/uL (ref 0.00–0.04)
ABS. IMM. GRANS.: 0 10*3/uL (ref 0.00–0.04)
ABS. LYMPHOCYTES: 1.3 10*3/uL (ref 0.8–3.5)
ABS. LYMPHOCYTES: 1.3 10*3/uL (ref 0.8–3.5)
ABS. MONOCYTES: 0.4 10*3/uL (ref 0.0–1.0)
ABS. MONOCYTES: 0.3 10*3/uL (ref 0.0–1.0)
ABS. NEUTROPHILS: 2.7 10*3/uL (ref 1.8–8.0)
ABS. NEUTROPHILS: 2 10*3/uL (ref 1.8–8.0)
BASOPHILS: 0 % (ref 0–1)
BASOPHILS: 0 % (ref 0–1)
EOSINOPHILS: 4 % (ref 0–7)
EOSINOPHILS: 5 % (ref 0–7)
HCT: 45.6 % (ref 36.6–50.3)
HCT: 45.4 % (ref 36.6–50.3)
HGB: 14.4 g/dL (ref 12.1–17.0)
HGB: 14.1 g/dL (ref 12.1–17.0)
IMMATURE GRANULOCYTES: 0 % (ref 0.0–0.5)
IMMATURE GRANULOCYTES: 0 % (ref 0.0–0.5)
LYMPHOCYTES: 28 % (ref 12–49)
LYMPHOCYTES: 35 % (ref 12–49)
MCH: 29.1 PG (ref 26.0–34.0)
MCH: 29 PG (ref 26.0–34.0)
MCHC: 31.6 g/dL (ref 30.0–36.5)
MCHC: 31.1 g/dL (ref 30.0–36.5)
MCV: 92.3 FL (ref 80.0–99.0)
MCV: 93.4 FL (ref 80.0–99.0)
MONOCYTES: 9 % (ref 5–13)
MONOCYTES: 7 % (ref 5–13)
MPV: 11.1 FL (ref 8.9–12.9)
MPV: 11.4 FL (ref 8.9–12.9)
NEUTROPHILS: 59 % (ref 32–75)
NEUTROPHILS: 53 % (ref 32–75)
PLATELET: 151 10*3/uL (ref 150–400)
PLATELET: 152 10*3/uL (ref 150–400)
RBC: 4.94 M/uL (ref 4.10–5.70)
RBC: 4.86 M/uL (ref 4.10–5.70)
RDW: 13.6 % (ref 11.5–14.5)
RDW: 13.6 % (ref 11.5–14.5)
WBC: 4.6 10*3/uL (ref 4.1–11.1)
WBC: 3.8 10*3/uL — ABNORMAL LOW (ref 4.1–11.1)

## 2019-06-08 LAB — PTT
aPTT: 32 s (ref 23.0–35.7)
aPTT: 102.3 s — ABNORMAL HIGH (ref 23.0–35.7)
aPTT: 108.3 s — ABNORMAL HIGH (ref 23.0–35.7)

## 2019-06-08 LAB — TROPONIN I
Troponin-I, Qt.: <0.05 ng/mL (ref <0.05)
Troponin-I, Qt.: <0.05 ng/mL (ref <0.05)

## 2019-06-08 LAB — METABOLIC PANEL, COMPREHENSIVE
A-G Ratio: 0.9 — ABNORMAL LOW (ref 1.1–2.2)
ALT (SGPT): 42 U/L (ref 12–78)
AST (SGOT): 29 U/L (ref 15–37)
Albumin: 3.4 g/dL — ABNORMAL LOW (ref 3.5–5.0)
Alk. phosphatase: 84 U/L (ref 45–117)
Anion gap: 4 mmol/L — ABNORMAL LOW (ref 5–15)
BUN/Creatinine ratio: 14 (ref 12–20)
BUN: 15 mg/dL (ref 6–20)
Bilirubin, total: 0.3 mg/dL (ref 0.2–1.0)
CO2: 28 mmol/L (ref 21–32)
Calcium: 8.6 mg/dL (ref 8.5–10.1)
Chloride: 107 mmol/L (ref 97–108)
Creatinine: 1.07 mg/dL (ref 0.70–1.30)
GFR est AA: >60 mL/min/{1.73_m2} (ref >60)
GFR est non-AA: >60 mL/min/{1.73_m2} (ref >60)
Globulin: 3.6 g/dL (ref 2.0–4.0)
Glucose: 154 mg/dL — ABNORMAL HIGH (ref 65–100)
Potassium: 3.5 mmol/L (ref 3.5–5.1)
Protein, total: 7 g/dL (ref 6.4–8.2)
Sodium: 139 mmol/L (ref 136–145)

## 2019-06-08 MED ORDER — HEPARIN (PORCINE) 1,000 UNIT/ML IJ SOLN
1000 unit/mL | INTRAMUSCULAR | Status: DC | PRN
Start: 2019-06-08 — End: 2019-06-09

## 2019-06-08 MED ORDER — SODIUM CHLORIDE 0.9 % IJ SYRG
INTRAMUSCULAR | Status: DC | PRN
Start: 2019-06-08 — End: 2019-06-09

## 2019-06-08 MED ORDER — HEPARIN (PORCINE) IN D5W 25,000 UNIT/250 ML IV
25000 unit/250 mL(100 unit/mL) | INTRAVENOUS | Status: DC
Start: 2019-06-08 — End: 2019-06-09
  Administered 2019-06-08 (×2): via INTRAVENOUS

## 2019-06-08 MED ORDER — POLYETHYLENE GLYCOL 3350 17 GRAM (100 %) ORAL POWDER PACKET
17 gram | Freq: Every day | ORAL | Status: DC | PRN
Start: 2019-06-08 — End: 2019-06-09

## 2019-06-08 MED ORDER — ACETAMINOPHEN 325 MG TABLET
325 mg | Freq: Four times a day (QID) | ORAL | Status: DC | PRN
Start: 2019-06-08 — End: 2019-06-09
  Administered 2019-06-08: 23:00:00 via ORAL

## 2019-06-08 MED ORDER — HEPARIN (PORCINE) 5,000 UNIT/ML IJ SOLN
5000 unit/mL | Freq: Once | INTRAMUSCULAR | Status: AC
Start: 2019-06-08 — End: 2019-06-08
  Administered 2019-06-08: 06:00:00 via INTRAVENOUS

## 2019-06-08 MED ORDER — IOPAMIDOL 76 % IV SOLN
76 % | Freq: Once | INTRAVENOUS | Status: AC
Start: 2019-06-08 — End: 2019-06-08
  Administered 2019-06-08: 05:00:00 via INTRAVENOUS

## 2019-06-08 MED ORDER — PROMETHAZINE 25 MG TAB
25 mg | Freq: Four times a day (QID) | ORAL | Status: DC | PRN
Start: 2019-06-08 — End: 2019-06-09

## 2019-06-08 MED ORDER — ENOXAPARIN 40 MG/0.4 ML SUB-Q SYRINGE
40 mg/0.4 mL | SUBCUTANEOUS | Status: DC
Start: 2019-06-08 — End: 2019-06-08
  Administered 2019-06-08: 06:00:00 via SUBCUTANEOUS

## 2019-06-08 MED ORDER — ONDANSETRON (PF) 4 MG/2 ML INJECTION
4 mg/2 mL | Freq: Four times a day (QID) | INTRAMUSCULAR | Status: DC | PRN
Start: 2019-06-08 — End: 2019-06-09

## 2019-06-08 MED ORDER — LISINOPRIL 10 MG TAB
10 mg | Freq: Every day | ORAL | Status: DC
Start: 2019-06-08 — End: 2019-06-09
  Administered 2019-06-08 – 2019-06-09 (×2): via ORAL

## 2019-06-08 MED ORDER — ACETAMINOPHEN 650 MG RECTAL SUPPOSITORY
650 mg | Freq: Four times a day (QID) | RECTAL | Status: DC | PRN
Start: 2019-06-08 — End: 2019-06-09

## 2019-06-08 MED ORDER — SODIUM CHLORIDE 0.9 % IJ SYRG
Freq: Three times a day (TID) | INTRAMUSCULAR | Status: DC
Start: 2019-06-08 — End: 2019-06-09
  Administered 2019-06-08 – 2019-06-09 (×3): via INTRAVENOUS

## 2019-06-08 MED FILL — HEPARIN (PORCINE) IN D5W 25,000 UNIT/250 ML IV: 25000 unit/250 mL(100 unit/mL) | INTRAVENOUS | Qty: 250

## 2019-06-08 MED FILL — ISOVUE-370  76 % INTRAVENOUS SOLUTION: 370 mg iodine /mL (76 %) | INTRAVENOUS | Qty: 100

## 2019-06-08 MED FILL — LISINOPRIL 10 MG TAB: 10 mg | ORAL | Qty: 1

## 2019-06-08 MED FILL — TYLENOL 325 MG TABLET: 325 mg | ORAL | Qty: 2

## 2019-06-08 MED FILL — SODIUM CHLORIDE 0.9 % IJ SYRG: INTRAMUSCULAR | Qty: 40

## 2019-06-08 NOTE — Consults (Signed)
Hematology Oncology Consultation    Reason for consult: DVT    Admitting Diagnosis: Pulmonary embolism ([place]) [I26.99]    Impression:   1. 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 Jehova witness  -declining transfusions    3. Prior hx of CVA    Ok to release once medically stable    Discussion: LLIAM Martinez is a  44 y.o. year old seen in consultation at the request of Dr. Jamelle Rushing for recurrent thrombosis.  He presented for evaluation of acute leg pain and progresssive shortness of breath.  In the ER he underwent imaging which showed recurrent thrombosis.  He has had a prior history of thrombosis and so was not surprised to learn of this.  He was started on a heparin drip.  No issues with anticoagulation previously.  No known contraindications to anticoagulation.  He had a prior cva from thromboembolic disease in 7564. Since then he has underwent PFO closure.  He was no longer on anticoagulation at the time of event.  He does have prolonged immobility as a consequence of long drives  Imaging:    CT scans reviewed    Labs:    Recent Results (from the past 24 hour(s))   CBC WITH AUTOMATED DIFF    Collection Time: 06/07/19 11:15 PM   Result Value Ref Range    WBC 4.6 4.1 - 11.1 K/uL    RBC 4.94 4.10 - 5.70 M/uL    HGB 14.4 12.1 - 17.0 g/dL    HCT 45.6 36.6 - 50.3 %    MCV 92.3 80.0 - 99.0 FL    MCH 29.1 26.0 - 34.0 PG    MCHC 31.6 30.0 - 36.5 g/dL    RDW 13.6 11.5 - 14.5 %    PLATELET 151 150 - 400 K/uL    MPV 11.1 8.9 - 12.9 FL    NEUTROPHILS 59 32 - 75 %    LYMPHOCYTES 28 12 - 49 %    MONOCYTES 9 5 - 13 %    EOSINOPHILS 4 0 - 7 %    BASOPHILS 0 0 - 1 %    IMMATURE GRANULOCYTES 0 0.0 - 0.5 %    ABS. NEUTROPHILS 2.7 1.8 - 8.0 K/UL    ABS.  LYMPHOCYTES 1.3 0.8 - 3.5 K/UL    ABS. MONOCYTES 0.4 0.0 - 1.0 K/UL    ABS. EOSINOPHILS 0.2 0.0 - 0.4 K/UL    ABS. BASOPHILS 0.0 0.0 - 0.1 K/UL    ABS. IMM. GRANS. 0.0 0.00 - 0.04 K/UL    DF AUTOMATED     METABOLIC PANEL, COMPREHENSIVE    Collection Time: 06/07/19 11:15 PM   Result Value Ref Range    Sodium 139 136 - 145 mmol/L    Potassium 3.5 3.5 - 5.1 mmol/L    Chloride 107 97 - 108 mmol/L    CO2 28 21 - 32 mmol/L    Anion gap 4 (L) 5 - 15 mmol/L    Glucose 154 (H) 65 - 100 mg/dL    BUN 15 6 - 20 mg/dL    Creatinine 1.07 0.70 - 1.30 mg/dL    BUN/Creatinine ratio 14 12 - 20      GFR est AA >60 >60  ml/min/1.76m    GFR est non-AA >60 >60 ml/min/1.726m   Calcium 8.6 8.5 - 10.1 mg/dL    Bilirubin, total 0.3 0.2 - 1.0 mg/dL    AST (SGOT) 29 15 - 37 U/L    ALT (SGPT) 42 12 - 78 U/L    Alk. phosphatase 84 45 - 117 U/L    Protein, total 7.0 6.4 - 8.2 g/dL    Albumin 3.4 (L) 3.5 - 5.0 g/dL    Globulin 3.6 2.0 - 4.0 g/dL    A-G Ratio 0.9 (L) 1.1 - 2.2     TROPONIN I    Collection Time: 06/07/19 11:15 PM   Result Value Ref Range    Troponin-I, Qt. <0.05 <0.05 ng/mL   TROPONIN I    Collection Time: 06/08/19  5:30 AM   Result Value Ref Range    Troponin-I, Qt. <0.05 <0.05 ng/mL   PTT    Collection Time: 06/08/19  5:30 AM   Result Value Ref Range    aPTT 32.0 23.0 - 35.7 sec    aPTT, therapeutic range   68 - 109 sec   CBC WITH AUTOMATED DIFF    Collection Time: 06/08/19  5:30 AM   Result Value Ref Range    WBC 3.8 (L) 4.1 - 11.1 K/uL    RBC 4.86 4.10 - 5.70 M/uL    HGB 14.1 12.1 - 17.0 g/dL    HCT 45.4 36.6 - 50.3 %    MCV 93.4 80.0 - 99.0 FL    MCH 29.0 26.0 - 34.0 PG    MCHC 31.1 30.0 - 36.5 g/dL    RDW 13.6 11.5 - 14.5 %    PLATELET 152 150 - 400 K/uL    MPV 11.4 8.9 - 12.9 FL    NEUTROPHILS 53 32 - 75 %    LYMPHOCYTES 35 12 - 49 %    MONOCYTES 7 5 - 13 %    EOSINOPHILS 5 0 - 7 %    BASOPHILS 0 0 - 1 %    IMMATURE GRANULOCYTES 0 0.0 - 0.5 %    ABS. NEUTROPHILS 2.0 1.8 - 8.0 K/UL    ABS. LYMPHOCYTES 1.3 0.8 - 3.5 K/UL     ABS. MONOCYTES 0.3 0.0 - 1.0 K/UL    ABS. EOSINOPHILS 0.2 0.0 - 0.4 K/UL    ABS. BASOPHILS 0.0 0.0 - 0.1 K/UL    ABS. IMM. GRANS. 0.0 0.00 - 0.04 K/UL    DF AUTOMATED         History:  Past Medical History:   Diagnosis Date   ??? Cerebral artery occlusion with cerebral infarction [place]   ??? PFO (patent foramen ovale)     s/p repair      History reviewed. No pertinent surgical history.   Prior to Admission medications    Not on File     No Known Allergies   Social History     Tobacco Use   ??? Smoking status: Never Smoker   ??? Smokeless tobacco: Never Used   Substance Use Topics   ??? Alcohol use: Yes     Comment: weekends      Family History   Problem Relation Age of Onset   ??? Cancer Father    ??? Heart Disease Father    ??? Heart Disease Maternal Aunt    ??? Migraines Paternal Aunt    ??? Heart Disease Maternal Grandmother         Current Medications:  Current Facility-Administered  Medications   Medication Dose Route Frequency   ??? heparin 25,000 units in D5W 250 ml infusion  18-36 Units/kg/hr (Adjusted) IntraVENous TITRATE   ??? sodium chloride (NS) flush 5-40 mL  5-40 mL IntraVENous Q8H   ??? sodium chloride (NS) flush 5-40 mL  5-40 mL IntraVENous PRN   ??? acetaminophen (TYLENOL) tablet 650 mg  650 mg Oral Q6H PRN    Or   ??? acetaminophen (TYLENOL) suppository 650 mg  650 mg Rectal Q6H PRN   ??? polyethylene glycol (MIRALAX) packet 17 g  17 g Oral DAILY PRN   ??? promethazine (PHENERGAN) tablet 12.5 mg  12.5 mg Oral Q6H PRN    Or   ??? ondansetron (ZOFRAN) injection 4 mg  4 mg IntraVENous Q6H PRN   ??? heparin (porcine) 1,000 unit/mL injection 7,350 Units  80 Units/kg (Adjusted) IntraVENous PRN    Or   ??? heparin (porcine) 1,000 unit/mL injection 3,680 Units  40 Units/kg (Adjusted) IntraVENous PRN         Review of Systems:  Constitutional No fevers, chills, night sweats, excessive fatigue or weight loss.   Allergic/Immunologic No recent allergic reactions   Eyes No significant visual difficulties. No diplopia.   ENMT No problems with  hearing, no sore throat, no sinus drainage.   Endocrine No hot flashes or night sweats. No cold intolerance, polyuria, or polydipsia   Hematologic/Lymphatic No easy bruising or bleeding.  The patient denies any tender or palpable lymph nodes   Breasts No abnormal masses of breast, nipple discharge or pain.   Respiratory No dyspnea on exertion, orthopnea, chest pain, cough or hemoptysis.   Cardiovascular No anginal chest pain, irregular heart beat, tachycardia, palpitations or orthopnea.   Gastrointestinal No nausea, vomiting, diarrhea, constipation, cramping, dysphagia, reflux, heartburn, GI bleeding, or early satiety.  No change in bowel habits.   Genitourinary (M) No hematuria, dysuria, increased frequency, urgency, hesitancy or incontinence.   Musculoskeletal No joint pain, swelling or redness. No decreased range of motion.   Integumentary No chronic rashes, inflammation, ulcerations, pruritus, petechiae, purpura, ecchymoses, or skin changes.   Neurologic No headache, blurred vision, and no areas of focal weakness or numbness. Normal gait. No sensory problems.   Psychiatric No insomnia, depression, mania or mood swings.  No psychotropic drugs.       Physical Exam:  Constitutional Alert, cooperative, oriented. Mood and affect appropriate. Appears close to chronological age. Well nourished. Well developed.   Head Normocephalic; no scars   Eyes Conjunctivae and sclerae are clear and without icterus. Pupils are reactive and equal.   ENMT Sinuses are nontender.  No oral exudates, ulcers, masses, thrush or mucositis. Oropharynx clear.  Tongue normal.   Neck Supple without masses or thyromegaly. No jugular venous distension.   Hematologic/Lymphatic No petechiae or purpura.  No tender or palpable lymph nodes in the cervical, supraclavicular, axillary or inguinal area.   Respiratory Lungs are clear to auscultation without rhonchi or wheezing.   Cardiovascular Regular rate and rhythm of heart without murmurs, gallops or  rubs.   Chest / Line Site Chest is symmetric with no chest wall deformities.   Abdomen Non-tender, non-distended, no masses, ascites or hepatosplenomegaly. Good bowel sounds. No guarding or rebound tenderness. No pulsatile masses.   Musculoskeletal No tenderness or swelling, normal range of motion without obvious weakness.   Extremities No visible deformities, no cyanosis, clubbing or edema.    Skin No rashes, scars, or lesions suggestive of malignancy. No petechiae, purpura, or ecchymoses. No excoriations.  Neurologic No sensory or motor deficits, normal cerebellar function, normal gait, cranial nerves intact.   Psychiatric Alert and oriented times three. Coherent speech. Verbalizes understanding of our discussions today.

## 2019-06-08 NOTE — Progress Notes (Signed)
Hospitalist Progress Note         Audria Nine, MD          Daily Progress Note: 06/08/2019      Subjective:   The patient is seen for follow  up. 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 ??Gregory Martinez currently drives 6 hours to [place] and back to [place] every weekend for work. ??Over the past week, Gregory Martinez has noticed left lower extremity swelling and pain. ??Additional symptoms include mild shortness of breath.  ??  This morning in follow up he is alert awake. No complaints this morning.   ??    Problem List:  Problem List as of 06/08/2019 Date Reviewed: May 23, 2018          Codes Class Noted - Resolved    Pulmonary embolism ([place]) ICD-10-CM: I26.99  ICD-9-CM: 415.19  06/08/2019 - Present        Severe obesity ([place]) ICD-10-CM: E66.01  ICD-9-CM: 278.01  03/01/2018 - Present              Medications reviewed  Current Facility-Administered Medications   Medication Dose Route Frequency   ??? heparin 25,000 units in D5W 250 ml infusion  18-36 Units/kg/hr (Adjusted) IntraVENous TITRATE   ??? sodium chloride (NS) flush 5-40 mL  5-40 mL IntraVENous Q8H   ??? sodium chloride (NS) flush 5-40 mL  5-40 mL IntraVENous PRN   ??? acetaminophen (TYLENOL) tablet 650 mg  650 mg Oral Q6H PRN    Or   ??? acetaminophen (TYLENOL) suppository 650 mg  650 mg Rectal Q6H PRN   ??? polyethylene glycol (MIRALAX) packet 17 g  17 g Oral DAILY PRN   ??? promethazine (PHENERGAN) tablet 12.5 mg  12.5 mg Oral Q6H PRN    Or   ??? ondansetron (ZOFRAN) injection 4 mg  4 mg IntraVENous Q6H PRN   ??? heparin (porcine) 1,000 unit/mL injection 7,350 Units  80 Units/kg (Adjusted) IntraVENous PRN    Or   ??? heparin (porcine) 1,000 unit/mL injection 3,680 Units  40 Units/kg (Adjusted) IntraVENous PRN       Review of Systems:   A comprehensive review of systems was negative except  for that written in the HPI.    Objective:   Physical Exam:     Visit Vitals  BP 130/85 (BP 1 Location: Right lower arm, BP Patient Position: At rest)   Pulse (!) 45   Temp 98 ??F (36.7 ??C)   Resp 14   Ht 6' (1.829 m)   Wt 113.4 kg (250 lb)   SpO2 99%   BMI 33.91 kg/m??      O2 Device: None (Room air)    Temp (24hrs), Avg:98 ??F (36.7 ??C), Min:98 ??F (36.7 ??C), Max:98 ??F (36.7 ??C)    No intake/output data recorded.   No intake/output data recorded.    General:  Alert, cooperative, no distress, appears stated age.   Lungs:   Clear to auscultation bilaterally.   Chest wall:  No tenderness or deformity.   Heart:  Regular rate and rhythm, S1, S2 normal, no murmur, click, rub or gallop.   Abdomen:   Soft, non-tender. Bowel sounds normal. No masses,  No organomegaly.   Extremities: Extremities normal, atraumatic, no cyanosis or edema.   Pulses: 2+ and symmetric all extremities.  Skin: Skin color, texture, turgor normal. No rashes or lesions   Neurologic: CNII-XII intact. No gross sensory or motor deficits     Data Review:       Recent Days:  Recent Labs     06/08/19  0530 06/07/19  2315   WBC 3.8* 4.6   HGB 14.1 14.4   HCT 45.4 45.6   PLT 152 151     Recent Labs     06/07/19  2315   NA 139   K 3.5   CL 107   CO2 28   GLU 154*   BUN 15   CREA 1.07   CA 8.6   ALB 3.4*   TBILI 0.3   ALT 42     No results for input(s): PH, PCO2, PO2, HCO3, FIO2 in the last 72 hours.    24 Hour Results:  Recent Results (from the past 24 hour(s))   CBC WITH AUTOMATED DIFF    Collection Time: 06/07/19 11:15 PM   Result Value Ref Range    WBC 4.6 4.1 - 11.1 K/uL    RBC 4.94 4.10 - 5.70 M/uL    HGB 14.4 12.1 - 17.0 g/dL    HCT 45.6 36.6 - 50.3 %    MCV 92.3 80.0 - 99.0 FL    MCH 29.1 26.0 - 34.0 PG    MCHC 31.6 30.0 - 36.5 g/dL    RDW 13.6 11.5 - 14.5 %    PLATELET 151 150 - 400 K/uL    MPV 11.1 8.9 - 12.9 FL    NEUTROPHILS 59 32 - 75 %    LYMPHOCYTES 28 12 - 49 %    MONOCYTES 9 5 - 13 %    EOSINOPHILS 4 0 - 7 %    BASOPHILS 0 0 - 1 %    IMMATURE  GRANULOCYTES 0 0.0 - 0.5 %    ABS. NEUTROPHILS 2.7 1.8 - 8.0 K/UL    ABS. LYMPHOCYTES 1.3 0.8 - 3.5 K/UL    ABS. MONOCYTES 0.4 0.0 - 1.0 K/UL    ABS. EOSINOPHILS 0.2 0.0 - 0.4 K/UL    ABS. BASOPHILS 0.0 0.0 - 0.1 K/UL    ABS. IMM. GRANS. 0.0 0.00 - 0.04 K/UL    DF AUTOMATED     METABOLIC PANEL, COMPREHENSIVE    Collection Time: 06/07/19 11:15 PM   Result Value Ref Range    Sodium 139 136 - 145 mmol/L    Potassium 3.5 3.5 - 5.1 mmol/L    Chloride 107 97 - 108 mmol/L    CO2 28 21 - 32 mmol/L    Anion gap 4 (L) 5 - 15 mmol/L    Glucose 154 (H) 65 - 100 mg/dL    BUN 15 6 - 20 mg/dL    Creatinine 1.07 0.70 - 1.30 mg/dL    BUN/Creatinine ratio 14 12 - 20      GFR est AA >60 >60 ml/min/1.67m    GFR est non-AA >60 >60 ml/min/1.775m   Calcium 8.6 8.5 - 10.1 mg/dL    Bilirubin, total 0.3 0.2 - 1.0 mg/dL    AST (SGOT) 29 15 - 37 U/L    ALT (SGPT) 42 12 - 78 U/L    Alk. phosphatase 84 45 - 117 U/L    Protein, total 7.0 6.4 - 8.2 g/dL    Albumin 3.4 (L) 3.5 - 5.0 g/dL    Globulin 3.6 2.0 - 4.0 g/dL    A-G Ratio 0.9 (L) 1.1 -  2.2     TROPONIN I    Collection Time: 06/07/19 11:15 PM   Result Value Ref Range    Troponin-I, Qt. <0.05 <0.05 ng/mL   TROPONIN I    Collection Time: 06/08/19  5:30 AM   Result Value Ref Range    Troponin-I, Qt. <0.05 <0.05 ng/mL   PTT    Collection Time: 06/08/19  5:30 AM   Result Value Ref Range    aPTT 32.0 23.0 - 35.7 sec    aPTT, therapeutic range   68 - 109 sec   CBC WITH AUTOMATED DIFF    Collection Time: 06/08/19  5:30 AM   Result Value Ref Range    WBC 3.8 (L) 4.1 - 11.1 K/uL    RBC 4.86 4.10 - 5.70 M/uL    HGB 14.1 12.1 - 17.0 g/dL    HCT 45.4 36.6 - 50.3 %    MCV 93.4 80.0 - 99.0 FL    MCH 29.0 26.0 - 34.0 PG    MCHC 31.1 30.0 - 36.5 g/dL    RDW 13.6 11.5 - 14.5 %    PLATELET 152 150 - 400 K/uL    MPV 11.4 8.9 - 12.9 FL    NEUTROPHILS 53 32 - 75 %    LYMPHOCYTES 35 12 - 49 %    MONOCYTES 7 5 - 13 %    EOSINOPHILS 5 0 - 7 %    BASOPHILS 0 0 - 1 %    IMMATURE GRANULOCYTES 0 0.0 - 0.5 %    ABS.  NEUTROPHILS 2.0 1.8 - 8.0 K/UL    ABS. LYMPHOCYTES 1.3 0.8 - 3.5 K/UL    ABS. MONOCYTES 0.3 0.0 - 1.0 K/UL    ABS. EOSINOPHILS 0.2 0.0 - 0.4 K/UL    ABS. BASOPHILS 0.0 0.0 - 0.1 K/UL    ABS. IMM. GRANS. 0.0 0.00 - 0.04 K/UL    DF AUTOMATED             Assessment/     Acute pulmonary embolism   R/O Acute DVT      Plan:  Continue IV Heparin  Obtain lower extremity Dopplers  Consult hematologist.  Patient may need a thrombophilic work-up  Probably discharge to home tomorrow on oral anticoagulation      Care Plan discussed with: Patient/Family    Total time spent with patient: 30 minutes.    Audria Nine, MD

## 2019-06-08 NOTE — Progress Notes (Signed)
Spoke with the pt regarding his post hospital care needs.  States independent with ADLs and IADLs.   Denies need for skilled follow up outside of physicians office at this time  Does not use any DME or specialty items in home or car.  Drives out of state for work weekly and expects that he will drive himself home at [place].  No intervention by CM at this time.

## 2019-06-08 NOTE — Progress Notes (Signed)
Admit skin check completed by this nurse and Chellen Tabamo,RN. No skin issues noted at this time.

## 2019-06-08 NOTE — Progress Notes (Signed)
*ATTENTION:  This note has been created by a medical student for educational purposes only.  Please do not refer to the content of this note for clinical decision-making, billing, or other purposes.  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  Gregory Martinez currently drives 6 hours to [place] and back to [place] every weekend for work.  Over the past week, Gregory Martinez has noticed left lower extremity swelling and pain.  Additional symptoms include mild shortness of breath.    This morning in follow up he is alert awake. No complaints this morning.   ??  .      Review of Systems  Constitutional: Denies fevers, chills, fatigue, weakness, unexplained weight loss, night sweats.  Head, Eyes, Ears, Nose, Mouth, Throat: Denies nasal congestion, sore throat, rhinorrhea, earache, ringing of the ears, difficulty hearing, facial pain, facial swelling.  Respiratory: Positive for shortness of breath. Denies wheezing, cough, sputum production, hemoptysis. Denies use of oxygen at home.  Cardiovascular: Denies chest pain, irregular heart beat, racing pulse, lower extremity edema, dizziness, dyspnea on exertion, orthopnea. Denies lower extremity edema.   Gastrointestinal: Denies nausea, vomiting, diarrhea, constipation, abdominal pain, loss of appetite, acid reflux, melena, hematochezia, change in bowel habits.  Endocrine: Denies intolerance to heat or cold. Denies polyuria, polydipsia, polyphagia. Denies recent weight changes.  Genitourinary: Denies increased urinary frequency, dysuria, hematuria, urinary incontinence, increased urinary frequency.  Integument/Breast: Denies rash, itching or new skin lesions.  Musculoskeletal: Positive  for left calf pain and swelling. Denies joint swelling, joint pain, neck pain, back pain.  Neurological: Denies headaches, dizziness, confusion, tremors, numbness/tingling, paresthesias, weakness, problems with balance, loss of consciousness.  Hematologic: Denies easy bleeding, easy bruising, lymphadenopathy.  Behavioral/Psychiatric: Denies anxiety, depression, increased irritability, mood swings, delusions, hallucination, SI/HI.      Objective:     Visit Vitals  BP 130/85 (BP 1 Location: Right lower arm, BP Patient Position: At rest)   Pulse (!) 56   Temp 98 ??F (36.7 ??C)   Resp 14   Ht 6' (1.829 m)   Wt 250 lb (113.4 kg)   SpO2 99%   BMI 33.91 kg/m??      O2 Device: None (Room air)    Temp (24hrs), Avg:98 ??F (36.7 ??C), Min:98 ??F (36.7 ??C), Max:98 ??F (36.7 ??C)      No intake/output data recorded.  No intake/output data recorded.    Visit Vitals  BP 130/85 (BP 1 Location: Right lower arm, BP Patient Position: At rest)   Pulse (!) 56   Temp 98 ??F (36.7 ??C)   Resp 14   Ht 6' (1.829 m)   Wt 250 lb (113.4 kg)   SpO2 99%   BMI 33.91 kg/m??     General:  Alert, cooperative, no distress, appears stated age.   Head:  Normocephalic, without obvious abnormality, atraumatic.   Eyes:  Conjunctivae/corneas clear. PERRL, EOMs intact. Fundi benign   Ears:  Normal TMs and external ear canals both ears.   Nose: Nares normal. Septum midline. Mucosa normal. No drainage or sinus tenderness.   Throat: Lips, mucosa, and tongue normal. Teeth and gums normal.   Neck: Supple, symmetrical, trachea midline,  no adenopathy, thyroid: no enlargement/tenderness/nodules, no carotid bruit and no JVD.   Back:   Symmetric, no curvature. ROM normal. No CVA tenderness.   Lungs:   Clear to auscultation bilaterally.   Chest wall:  No tenderness or deformity.   Heart:  Regular rate and rhythm, S1, S2 normal, no murmur, click, rub or gallop.   Abdomen:   Soft, non-tender. Bowel sounds normal. No masses,  No organomegaly.   Genitalia:  Normal male without  lesion, discharge or tenderness.   Rectal:  Normal tone, normal prostate, no masses or tenderness  Guaiac negative stool.   Extremities: Left calf edematous and red   Pulses: 2+ and symmetric all extremities.   Skin: Skin color, texture, turgor normal. No rashes or lesions   Lymph nodes: Cervical, supraclavicular, and axillary nodes normal.   Neurologic: CNII-XII intact. Normal strength, sensation and reflexes throughout.           Data Review    Recent Results (from the past 24 hour(s))   CBC WITH AUTOMATED DIFF    Collection Time: 06/07/19 11:15 PM   Result Value Ref Range    WBC 4.6 4.1 - 11.1 K/uL    RBC 4.94 4.10 - 5.70 M/uL    HGB 14.4 12.1 - 17.0 g/dL    HCT 45.6 36.6 - 50.3 %    MCV 92.3 80.0 - 99.0 FL    MCH 29.1 26.0 - 34.0 PG    MCHC 31.6 30.0 - 36.5 g/dL    RDW 13.6 11.5 - 14.5 %    PLATELET 151 150 - 400 K/uL    MPV 11.1 8.9 - 12.9 FL    NEUTROPHILS 59 32 - 75 %    LYMPHOCYTES 28 12 - 49 %    MONOCYTES 9 5 - 13 %    EOSINOPHILS 4 0 - 7 %    BASOPHILS 0 0 - 1 %    IMMATURE GRANULOCYTES 0 0.0 - 0.5 %    ABS. NEUTROPHILS 2.7 1.8 - 8.0 K/UL    ABS. LYMPHOCYTES 1.3 0.8 - 3.5 K/UL    ABS. MONOCYTES 0.4 0.0 - 1.0 K/UL    ABS. EOSINOPHILS 0.2 0.0 - 0.4 K/UL    ABS. BASOPHILS 0.0 0.0 - 0.1 K/UL    ABS. IMM. GRANS. 0.0 0.00 - 0.04 K/UL    DF AUTOMATED     METABOLIC PANEL, COMPREHENSIVE    Collection Time: 06/07/19 11:15 PM   Result Value Ref Range    Sodium 139 136 - 145 mmol/L    Potassium 3.5 3.5 - 5.1 mmol/L    Chloride 107 97 - 108 mmol/L    CO2 28 21 - 32 mmol/L    Anion gap 4 (L) 5 - 15 mmol/L    Glucose 154 (H) 65 - 100 mg/dL    BUN 15 6 - 20 mg/dL    Creatinine 1.07 0.70 - 1.30 mg/dL    BUN/Creatinine ratio 14 12 - 20      GFR est AA >60 >60 ml/min/1.9m    GFR est non-AA >60 >60 ml/min/1.783m   Calcium 8.6 8.5 - 10.1 mg/dL    Bilirubin, total 0.3 0.2 - 1.0 mg/dL    AST (SGOT) 29 15 - 37 U/L    ALT (SGPT) 42 12 - 78 U/L    Alk. phosphatase 84 45 - 117 U/L    Protein, total 7.0 6.4 - 8.2 g/dL    Albumin  3.4 (L) 3.5 - 5.0 g/dL  Globulin 3.6 2.0 - 4.0 g/dL    A-G Ratio 0.9 (L) 1.1 - 2.2     TROPONIN I    Collection Time: 06/07/19 11:15 PM   Result Value Ref Range    Troponin-I, Qt. <0.05 <0.05 ng/mL   TROPONIN I    Collection Time: 06/08/19  5:30 AM   Result Value Ref Range    Troponin-I, Qt. <0.05 <0.05 ng/mL   PTT    Collection Time: 06/08/19  5:30 AM   Result Value Ref Range    aPTT 32.0 23.0 - 35.7 sec    aPTT, therapeutic range   68 - 109 sec   CBC WITH AUTOMATED DIFF    Collection Time: 06/08/19  5:30 AM   Result Value Ref Range    WBC 3.8 (L) 4.1 - 11.1 K/uL    RBC 4.86 4.10 - 5.70 M/uL    HGB 14.1 12.1 - 17.0 g/dL    HCT 45.4 36.6 - 50.3 %    MCV 93.4 80.0 - 99.0 FL    MCH 29.0 26.0 - 34.0 PG    MCHC 31.1 30.0 - 36.5 g/dL    RDW 13.6 11.5 - 14.5 %    PLATELET 152 150 - 400 K/uL    MPV 11.4 8.9 - 12.9 FL    NEUTROPHILS 53 32 - 75 %    LYMPHOCYTES 35 12 - 49 %    MONOCYTES 7 5 - 13 %    EOSINOPHILS 5 0 - 7 %    BASOPHILS 0 0 - 1 %    IMMATURE GRANULOCYTES 0 0.0 - 0.5 %    ABS. NEUTROPHILS 2.0 1.8 - 8.0 K/UL    ABS. LYMPHOCYTES 1.3 0.8 - 3.5 K/UL    ABS. MONOCYTES 0.3 0.0 - 1.0 K/UL    ABS. EOSINOPHILS 0.2 0.0 - 0.4 K/UL    ABS. BASOPHILS 0.0 0.0 - 0.1 K/UL    ABS. IMM. GRANS. 0.0 0.00 - 0.04 K/UL    DF AUTOMATED         Current Facility-Administered Medications   Medication Dose Route Frequency   ??? heparin 25,000 units in D5W 250 ml infusion  18-36 Units/kg/hr (Adjusted) IntraVENous TITRATE   ??? sodium chloride (NS) flush 5-40 mL  5-40 mL IntraVENous Q8H   ??? sodium chloride (NS) flush 5-40 mL  5-40 mL IntraVENous PRN   ??? acetaminophen (TYLENOL) tablet 650 mg  650 mg Oral Q6H PRN    Or   ??? acetaminophen (TYLENOL) suppository 650 mg  650 mg Rectal Q6H PRN   ??? polyethylene glycol (MIRALAX) packet 17 g  17 g Oral DAILY PRN   ??? promethazine (PHENERGAN) tablet 12.5 mg  12.5 mg Oral Q6H PRN    Or   ??? ondansetron (ZOFRAN) injection 4 mg  4 mg IntraVENous Q6H PRN   ??? heparin (porcine) 1,000 unit/mL injection 7,350  Units  80 Units/kg (Adjusted) IntraVENous PRN    Or   ??? heparin (porcine) 1,000 unit/mL injection 3,680 Units  40 Units/kg (Adjusted) IntraVENous PRN         Assessment/Plan:     Active Problems:    Pulmonary embolism ([place]) (06/08/2019)      CT of the chest is positive for right lower lobe pulmonary embolism without right heart strain.     Waiting for doppler study of left lower extremity. If negative, patient is stable for discharge on oral anticoagulant.         Total time spent with patient: 15  [phone].    Seen on 05/31/2019 at 15:30 x 40 minutes.

## 2019-06-08 NOTE — ED Notes (Signed)
TRANSFER - OUT REPORT:    Verbal report given to Lisa, RN (name) on Gregory Martinez  being transferred to 2E (unit) for routine progression of care       Report consisted of patient???s Situation, Background, Assessment and   Recommendations(SBAR).     Information from the following report(s) SBAR, ED Summary, Intake/Output, MAR, Recent Results and Cardiac Rhythm NSR was reviewed with the receiving nurse.    Lines:   Peripheral IV 06/07/19 Left Antecubital (Active)       Peripheral IV 06/08/19 Right Basilic (Active)        Opportunity for questions and clarification was provided.      Patient transported with:   Monitor  Tech

## 2019-06-08 NOTE — Progress Notes (Signed)
Reason for Admission:  sxs-edema  Ha  hbp  Previous stroke                     RUR Score: 7%                    Plan for utilizing home health:    none      PCP: First and Last name: does not have one      Name of Practice:    Are you a current patient: Yes/No:    Approximate date of last visit:    Can you participate in a virtual visit with your PCP:                     Current Advanced Directive/Advance Care Plan: Full Code      Healthcare Decision Maker:   Click here to complete HealthCare Decision Makers including selection of the Healthcare Decision Maker Relationship (ie "Primary")                             Transition of Care Plan:  Will find a new pcp though sees Dr Morris/Cardiolgy who he may follow up with.

## 2019-06-08 NOTE — H&P (Signed)
History and Physical    Patient: Gregory Martinez MRN: [id number]  SSN: [id number]    Date of Birth: 01/20/1976  Age: 44 y.o.  Sex: male      Subjective:      Chief Complaint: Left lower extremity swelling and pain    HPI: Gregory Martinez is a 44 y.o. male with past medical history of previous cerebrovascular accident status post TPA in 2014 presenting to the ER with complaints of left lower extremity swelling.  Gregory Martinez currently drives 6 hours to [place] and back to [place] every weekend for work.  Over the past week, Gregory Martinez has noticed left lower extremity swelling and pain.  Patient reports that on March 6 he was seen by a podiatrist for left heel pain and diagnosed with plantar fasciitis.  He was prescribed steroids.  Gregory Martinez developed left calf pain approximately 1 week later and had follow-up outpatient visit with podiatrist.  Calf pain was thought to be secondary to muscular strain with recent plantar fasciitis.  He was prescribed naproxen.  Swelling developed approximately 1 week later.  Additional symptoms include mild shortness of breath.  Gregory Martinez describes shortness of breath as needing to deeply inhale.  Additional complaints include elevated blood pressure today with a value of 138/94.  Gregory Martinez reports that his blood pressure does not run that high.  This evening he presented to the ER for further treatment and evaluation.    Cerebrovascular accident 2014 use thought to be secondary to blood clot that passed through PFO.  Gregory Martinez received TPA and underwent PFO repair.  Patient's father had a diagnosis of lymphoma and developed DVT.    Past medical history, past surgical history, family history, social history and home medication list was reviewed at the time of admission.  Wife states he can be reached at [phone].  There is a remote history of smoking and patient occasionally drinks alcohol on the weekends.  Gregory Martinez denies illicit drug use.  He is a full code.     Past Medical History:   Diagnosis Date   ??? 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 PFO (patent foramen ovale)     s/p repair     History reviewed. No pertinent surgical history.   Family History   Problem Relation Age of Onset   ??? Cancer Father    ??? Heart Disease Father    ??? Heart Disease Maternal Aunt    ??? Migraines Paternal Aunt    ??? Heart Disease Maternal Grandmother      Social History     Tobacco Use   ??? Smoking status: Never Smoker   ??? Smokeless tobacco: Never Used   Substance Use Topics   ??? Alcohol use: Yes     Comment: weekends      Prior to Admission medications    Not on File        No Known Allergies    Review of Systems:  Constitutional: Denies fevers, chills, fatigue, weakness, unexplained weight loss, night sweats.  Head, Eyes, Ears, Nose, Mouth, Throat: Denies nasal congestion, sore throat, rhinorrhea, earache, ringing of the ears, difficulty hearing, facial pain, facial swelling.  Respiratory: Positive for shortness of breath. Denies wheezing, cough, sputum production, hemoptysis. Denies use of oxygen at home.  Cardiovascular: Denies chest pain, irregular heart beat, racing pulse, lower extremity edema, dizziness, dyspnea on exertion, orthopnea. Denies lower extremity edema.   Gastrointestinal: Denies nausea, vomiting, diarrhea, constipation, abdominal  pain, loss of appetite, acid reflux, melena, hematochezia, change in bowel habits.  Endocrine: Denies intolerance to heat or cold. Denies polyuria, polydipsia, polyphagia. Denies recent weight changes.  Genitourinary: Denies increased urinary frequency, dysuria, hematuria, urinary incontinence, increased urinary frequency.  Integument/Breast: Denies rash, itching or new skin lesions.  Musculoskeletal: Positive for left calf pain and swelling. Denies joint swelling, joint pain, neck pain, back pain.  Neurological: Denies headaches, dizziness, confusion, tremors, numbness/tingling, paresthesias, weakness, problems with balance,  loss of consciousness.  Hematologic: Denies easy bleeding, easy bruising, lymphadenopathy.  Behavioral/Psychiatric: Denies anxiety, depression, increased irritability, mood swings, delusions, hallucination, SI/HI.      Objective:     Vitals:    06/07/19 2208 06/07/19 2238 06/08/19 0050   BP: (!) 147/90 (!) 159/97 (!) 154/92   Pulse: 76 68 63   Resp: _0 Temp: 98 ??F (36.7 ??C)     SpO2: 100% 98% 95%   Weight: 113.4 kg (250 lb)     Height: 6' (1.829 m)          Physical Exam:  General: Alert and Oriented x 3. Cooperative and friendly. No acute distress. Nourished and well developed.   Head/Eyes: Normocephalic, atraumatic, EOMI, PERRLA   Nose/Mouth: Turbinates within normal limits, No drainage. Mucous membranes are moist.  Throat and Neck: Posterior pharynx without erythema or exudate. No masses, JVD, thyromegaly or lymphadenopathy appreciated. Cervical spine has good range of motion without pain.  Lungs: Clear to auscultation bilaterally without wheezes, rhonchi or crackles. Good air movement bilaterally. Symmetric chest rise with respirations.  Heart: Regular rate and rhythm. Normal S1/S2. No appreciated murmurs, rubs or gallops.    Abdomen: Soft, non-tender, non-distended. Bowel sounds present in all four quadrants. No masses appreciated.  Extremities: Positive for mild left calf tenderness. Left calf circumference if greater than right calf. Atraumatic. Able to move all extremities symmetrically. No abnormal bony protuberances appreciated.  Back: No pain with palpation over spinous processes or paraspinal musculature. No CVA tenderness.  Skin: Clean, dry and intact without appreciated lesions.   Neurologic: A&Ox3. Cranial nerves 2-12 are grossly intact. No focal deficits.   Psychiatric: Normal affect, normal thought process, good eye contact.     Recent Results (from the past 24 hour(s))   CBC WITH AUTOMATED DIFF    Collection Time: 06/07/19 11:15 PM   Result Value Ref Range    WBC 4.6 4.1 - 11.1 K/uL    RBC  4.94 4.10 - 5.70 M/uL    HGB 14.4 12.1 - 17.0 g/dL    HCT 45.6 36.6 - 50.3 %    MCV 92.3 80.0 - 99.0 FL    MCH 29.1 26.0 - 34.0 PG    MCHC 31.6 30.0 - 36.5 g/dL    RDW 13.6 11.5 - 14.5 %    PLATELET 151 150 - 400 K/uL    MPV 11.1 8.9 - 12.9 FL    NEUTROPHILS 59 32 - 75 %    LYMPHOCYTES 28 12 - 49 %    MONOCYTES 9 5 - 13 %    EOSINOPHILS 4 0 - 7 %    BASOPHILS 0 0 - 1 %    IMMATURE GRANULOCYTES 0 0.0 - 0.5 %    ABS. NEUTROPHILS 2.7 1.8 - 8.0 K/UL    ABS. LYMPHOCYTES 1.3 0.8 - 3.5 K/UL    ABS. MONOCYTES 0.4 0.0 - 1.0 K/UL    ABS. EOSINOPHILS 0.2 0.0 - 0.4 K/UL    ABS. BASOPHILS 0.0  0.0 - 0.1 K/UL    ABS. IMM. GRANS. 0.0 0.00 - 0.04 K/UL    DF AUTOMATED     METABOLIC PANEL, COMPREHENSIVE    Collection Time: 06/07/19 11:15 PM   Result Value Ref Range    Sodium 139 136 - 145 mmol/L    Potassium 3.5 3.5 - 5.1 mmol/L    Chloride 107 97 - 108 mmol/L    CO2 28 21 - 32 mmol/L    Anion gap 4 (L) 5 - 15 mmol/L    Glucose 154 (H) 65 - 100 mg/dL    BUN 15 6 - 20 mg/dL    Creatinine 1.07 0.70 - 1.30 mg/dL    BUN/Creatinine ratio 14 12 - 20      GFR est AA >60 >60 ml/min/1.63m    GFR est non-AA >60 >60 ml/min/1.761m   Calcium 8.6 8.5 - 10.1 mg/dL    Bilirubin, total 0.3 0.2 - 1.0 mg/dL    AST (SGOT) 29 15 - 37 U/L    ALT (SGPT) 42 12 - 78 U/L    Alk. phosphatase 84 45 - 117 U/L    Protein, total 7.0 6.4 - 8.2 g/dL    Albumin 3.4 (L) 3.5 - 5.0 g/dL    Globulin 3.6 2.0 - 4.0 g/dL    A-G Ratio 0.9 (L) 1.1 - 2.2     TROPONIN I    Collection Time: 06/07/19 11:15 PM   Result Value Ref Range    Troponin-I, Qt. <0.05 <0.05 ng/mL       XR Results (maximum last 3):  Results from [facility] on 06/07/19   XR CHEST SNGL V    Narrative Indication: Shortness of breath.    AP semiupright portable chest radiograph 2229 hours 07 June 2019. No  comparison.    Clear lungs.  Normal heart size.  No pleural effusion or pneumothorax.      Impression No acute finding.           CT Results (maximum last 3):  Results from [facility] on 06/07/19   CTA CHEST W OR W WO CONT    Narrative Indication: Shortness of breath. History of DVT.    Dose reduction: All CT scans done at this facility are performed using dose  reduction optimization techniques as appropriate to a performed exam including  the following: Automated exposure control, adjustments of the mA and/or kV  according to patient size, or use of iterative reconstruction technique.    CTA chest, PE protocol, 100 cc Isovue-370 IV, multiplanar standard and MIP  reformatting 06/08/2019. Correlation chest radiograph the same day.    Small emboli in right lower lobe pulmonary arteries. No right heart strain.  Normal thoracic aorta.  No pulmonary infiltrate.  Small hepatic cysts.  Partly included left renal cysts.      Impression Right lower lobe pulmonary emboli.    Report called to Dr. KaElly Modena:10 AM.       Assessment:     MiJAZPER NIKOLAIs a [age].o. male presenting to the ER with complaints of left lower calf pain and swelling with mild shortness of breath.  CT of the chest is positive for right lower lobe pulmonary embolism without right heart strain.     Plan:     1. Admit to telemetry bed.   2. Continue IV Heparin Drip.  3. Order lower extremity Duplex for possible lower extremity DVTs.     GI PPX: Diet ordered.  DVT PPX: Chemical PPX is not indicated while on IV Heparin. Hold SCDs with pending lower extremity duplex to rule out DVTs.    Signed By: Serita Butcher, MD     June 08, 2019

## 2019-06-08 NOTE — Progress Notes (Signed)
Problem: Falls - Risk of  Goal: *Absence of Falls  Description: Document Schmid Fall Risk and appropriate interventions in the flowsheet.  Outcome: Progressing Towards Goal  Note: Fall Risk Interventions:            Medication Interventions: Bed/chair exit alarm, Patient to call before getting OOB

## 2019-06-08 NOTE — ED Notes (Signed)
Heparin bolus and gtt started by Thomas Ellis, RN witnessed and verified by Karen Anderson, RN at the bedside at 05:50. Follow up PTT to be obtained at 11:50

## 2019-06-08 NOTE — ACP (Advance Care Planning) (Signed)
Advance Care Planning     Advance Care Planning (ACP) Physician/NP/PA Conversation      Date of Conversation: 06/07/2019  Conducted with: Patient with Decision Making Capacity    Healthcare Decision Maker:     Click here to complete HealthCare Decision Makers including selection of the Healthcare Decision Maker Relationship (ie "Primary")  Today we documented Decision Maker(s) consistent with Legal Next of Kin hierarchy.    Care Preferences:    Hospitalization: "If your health worsens and it becomes clear that your chance of recovery is unlikely, what would be your preference regarding hospitalization?"  The patient would prefer hospitalization.    Ventilation: "If you were unable to breathe on your own and your chance of recovery was unlikely, what would be your preference about the use of a ventilator (breathing machine) if it was available to you?"   The patient would desire the use of a ventilator.    Resuscitation: "In the event your heart stopped as a result of an underlying serious health condition, would you want attempts to be made to restart your heart, or would you prefer a natural death?"   Yes, attempt to resuscitate.    Additional topics discussed: resuscitation preferences    Conversation Outcomes / Follow-Up Plan:   ACP complete - no further action today  Reviewed DNR/DNI and patient elects Full Code (Attempt Resuscitation)     Length of Voluntary ACP Conversation in minutes:  <16 minutes (Non-Billable)    Tycho Cheramie C Glorine Hanratty, MD

## 2019-06-08 NOTE — Progress Notes (Cosign Needed)
Called lab to follow up on PTT  blood draw informed this nurse that phlebotomist is on the floor to draw labs. will continue to follow up.

## 2019-06-08 NOTE — Progress Notes (Signed)
Progress Notes by Audria Nine, MD at 06/08/19 [phone]                Author: Audria Nine, MD  Service: Internal Medicine  Author Type: Physician       Filed: 06/08/19 0945  Date of Service: 06/08/19 0943  Status: Signed          Editor: Audria Nine, MD (Physician)                                                 Hospitalist Progress Note          Audria Nine, MD               Daily Progress Note: 06/08/2019           Subjective:     The patient is seen for follow  up. Gregory Martinez is a 44 year old male with a medical history of previous thrombus  for which he was treated with PFO repair and TPA in 2014 presenting to the ER with complaints of left lower extremity swelling. ??Gregory Martinez currently drives 6 hours to [place] and back to [place] every weekend for work. ??Over the past week,  Gregory Martinez has noticed left lower extremity swelling and pain. ??Additional symptoms include mild shortness of breath.   ??   This morning in follow up he is alert awake. No complaints this morning.    ??      Problem List:      Problem List  as of 06/08/2019  Date Reviewed:  June 14, 2018                        Codes  Class  Noted - Resolved             Pulmonary embolism ([place])  ICD-10-CM: I26.99   ICD-9-CM: 415.19    06/08/2019 - Present                       Severe obesity ([place])  ICD-10-CM: E66.01   ICD-9-CM: 278.01    03/01/2018 - Present                          Medications reviewed     Current Facility-Administered Medications          Medication  Dose  Route  Frequency           ?  heparin 25,000 units in D5W 250 ml infusion   18-36 Units/kg/hr (Adjusted)  IntraVENous  TITRATE     ?  sodium chloride (NS) flush 5-40 mL   5-40 mL  IntraVENous  Q8H     ?  sodium chloride (NS) flush 5-40 mL   5-40 mL  IntraVENous  PRN     ?  acetaminophen (TYLENOL) tablet 650 mg   650 mg  Oral  Q6H PRN          Or           ?  acetaminophen (TYLENOL) suppository 650 mg   650 mg  Rectal  Q6H PRN     ?  polyethylene glycol  (MIRALAX) packet 17 g   17 g  Oral  DAILY PRN     ?  promethazine (PHENERGAN)  tablet 12.5 mg   12.5 mg  Oral  Q6H PRN          Or           ?  ondansetron (ZOFRAN) injection 4 mg   4 mg  IntraVENous  Q6H PRN     ?  heparin (porcine) 1,000 unit/mL injection 7,350 Units   80 Units/kg (Adjusted)  IntraVENous  PRN          Or           ?  heparin (porcine) 1,000 unit/mL injection 3,680 Units   40 Units/kg (Adjusted)  IntraVENous  PRN             Review of Systems:     A comprehensive review of systems was negative except for that written in the HPI.        Objective:     Physical Exam:       Visit Vitals      BP  130/85 (BP 1 Location: Right lower arm, BP Patient Position: At rest)     Pulse  (!) 45     Temp  98 ??F (36.7 ??C)     Resp  14     Ht  6' (1.829 m)     Wt  113.4 kg (250 lb)     SpO2  99%        BMI  33.91 kg/m??        O2 Device:  None (Room air)      Temp (24hrs), Avg:98 ??F (36.7 ??C), Min:98 ??F (36.7 ??C), Max:98 ??F (36.7 ??C)     No intake/output data recorded.    No intake/output data recorded.         General:   Alert, cooperative, no distress, appears stated age.        Lungs:    Clear to auscultation bilaterally.        Chest wall:   No tenderness or deformity.        Heart:   Regular rate and rhythm, S1, S2 normal, no murmur, click, rub or gallop.        Abdomen:    Soft, non-tender. Bowel sounds normal. No masses,  No organomegaly.     Extremities:  Extremities normal, atraumatic, no cyanosis or edema.     Pulses:  2+ and symmetric all extremities.     Skin:  Skin color, texture, turgor normal. No rashes or lesions        Neurologic:  CNII-XII intact. No gross sensory or motor deficits          Data Review:          Recent Days:     Recent Labs            06/08/19   0530  06/07/19   2315     WBC  3.8*  4.6     HGB  14.1  14.4     HCT  45.4  45.6         PLT  152  151          Recent Labs           06/07/19   2315     NA  139     K  3.5     CL  107     CO2  28     GLU  154*     BUN  15     CREA  1.07      CA  8.6     ALB  3.4*     TBILI  0.3        ALT  42        No results for input(s): PH, PCO2, PO2, HCO3, FIO2 in the last 72 hours.      24 Hour Results:     Recent Results (from the past 24 hour(s))     CBC WITH AUTOMATED DIFF          Collection Time: 06/07/19 11:15 PM         Result  Value  Ref Range            WBC  4.6  4.1 - 11.1 K/uL       RBC  4.94  4.10 - 5.70 M/uL       HGB  14.4  12.1 - 17.0 g/dL       HCT  45.6  36.6 - 50.3 %       MCV  92.3  80.0 - 99.0 FL       MCH  29.1  26.0 - 34.0 PG       MCHC  31.6  30.0 - 36.5 g/dL       RDW  13.6  11.5 - 14.5 %       PLATELET  151  150 - 400 K/uL       MPV  11.1  8.9 - 12.9 FL       NEUTROPHILS  59  32 - 75 %       LYMPHOCYTES  28  12 - 49 %       MONOCYTES  9  5 - 13 %            EOSINOPHILS  4  0 - 7 %            BASOPHILS  0  0 - 1 %       IMMATURE GRANULOCYTES  0  0.0 - 0.5 %       ABS. NEUTROPHILS  2.7  1.8 - 8.0 K/UL       ABS. LYMPHOCYTES  1.3  0.8 - 3.5 K/UL       ABS. MONOCYTES  0.4  0.0 - 1.0 K/UL       ABS. EOSINOPHILS  0.2  0.0 - 0.4 K/UL       ABS. BASOPHILS  0.0  0.0 - 0.1 K/UL       ABS. IMM. GRANS.  0.0  0.00 - 0.04 K/UL       DF  AUTOMATED          METABOLIC PANEL, COMPREHENSIVE          Collection Time: 06/07/19 11:15 PM         Result  Value  Ref Range            Sodium  139  136 - 145 mmol/L       Potassium  3.5  3.5 - 5.1 mmol/L       Chloride  107  97 - 108 mmol/L       CO2  28  21 - 32 mmol/L       Anion gap  4 (L)  5 - 15 mmol/L       Glucose  154 (H)  65 - 100 mg/dL       BUN  15  6 - 20 mg/dL  Creatinine  1.07  0.70 - 1.30 mg/dL       BUN/Creatinine ratio  14  12 - 20         GFR est AA  >60  >60 ml/min/1.23m       GFR est non-AA  >60  >60 ml/min/1.753m      Calcium  8.6  8.5 - 10.1 mg/dL       Bilirubin, total  0.3  0.2 - 1.0 mg/dL       AST (SGOT)  29  15 - 37 U/L       ALT (SGPT)  42  12 - 78 U/L       Alk. phosphatase  84  45 - 117 U/L       Protein, total  7.0  6.4 - 8.2 g/dL       Albumin  3.4 (L)  3.5 - 5.0 g/dL        Globulin  3.6  2.0 - 4.0 g/dL       A-G Ratio  0.9 (L)  1.1 - 2.2         TROPONIN I          Collection Time: 06/07/19 11:15 PM         Result  Value  Ref Range            Troponin-I, Qt.  <0.05  <0.05 ng/mL       TROPONIN I          Collection Time: 06/08/19  5:30 AM         Result  Value  Ref Range            Troponin-I, Qt.  <0.05  <0.05 ng/mL       PTT          Collection Time: 06/08/19  5:30 AM         Result  Value  Ref Range            aPTT  32.0  23.0 - 35.7 sec       aPTT, therapeutic range     68 - 109 sec       CBC WITH AUTOMATED DIFF          Collection Time: 06/08/19  5:30 AM         Result  Value  Ref Range            WBC  3.8 (L)  4.1 - 11.1 K/uL       RBC  4.86  4.10 - 5.70 M/uL       HGB  14.1  12.1 - 17.0 g/dL       HCT  45.4  36.6 - 50.3 %       MCV  93.4  80.0 - 99.0 FL       MCH  29.0  26.0 - 34.0 PG       MCHC  31.1  30.0 - 36.5 g/dL       RDW  13.6  11.5 - 14.5 %       PLATELET  152  150 - 400 K/uL       MPV  11.4  8.9 - 12.9 FL       NEUTROPHILS  53  32 - 75 %       LYMPHOCYTES  35  12 - 49 %       MONOCYTES  7  5 - 13 %  EOSINOPHILS  5  0 - 7 %       BASOPHILS  0  0 - 1 %       IMMATURE GRANULOCYTES  0  0.0 - 0.5 %       ABS. NEUTROPHILS  2.0  1.8 - 8.0 K/UL       ABS. LYMPHOCYTES  1.3  0.8 - 3.5 K/UL       ABS. MONOCYTES  0.3  0.0 - 1.0 K/UL       ABS. EOSINOPHILS  0.2  0.0 - 0.4 K/UL       ABS. BASOPHILS  0.0  0.0 - 0.1 K/UL       ABS. IMM. GRANS.  0.0  0.00 - 0.04 K/UL            DF  AUTOMATED                      Assessment/        Acute pulmonary embolism    R/O Acute DVT         Plan:   Continue IV Heparin   Obtain lower extremity Dopplers   Consult hematologist.  Patient may need a thrombophilic work-up   Probably discharge to home tomorrow on oral anticoagulation         Care Plan discussed with: Patient/Family      Total time spent with patient: 30 minutes.      Audria Nine, MD

## 2019-06-08 NOTE — ED Notes (Signed)
Heparin bolus and gtt started by Lucretia Roers, RN witnessed and verified by Barnetta Chapel, RN at the bedside at 05:50. Follow up PTT to be obtained at 11:50

## 2019-06-08 NOTE — Progress Notes (Signed)
Spoke with the pt regarding his post hospital care needs.  States independent with ADLs and IADLs.   Denies need for skilled follow up outside of physicians office at this time  Does not use any DME or specialty items in home or car.  Drives out of state for work weekly and expects that he will drive himself home at [place].  No intervention by CM at this time.

## 2019-06-08 NOTE — Progress Notes (Signed)
 Reason for Admission:  sxs-edema  Ha  hbp  Previous stroke                     RUR Score: 7%                    Plan for utilizing home health:    none      PCP: First and Last name: does not have one      Name of Practice:    Are you a current patient: Yes/No:    Approximate date of last visit:    Can you participate in a virtual visit with your PCP:                     Current Advanced Directive/Advance Care Plan: Full Code      Healthcare Decision Maker:   Click here to complete HealthCare Decision Makers including selection of the Healthcare Decision Maker Relationship (ie Primary)                             Transition of Care Plan:  Will find a new pcp though sees Dr Morris/Cardiolgy who he may follow up with.

## 2019-06-08 NOTE — H&P (Signed)
History and Physical    Patient: Gregory Martinez MRN: [id number]  SSN: [id number]    Date of Birth: January 12, 1976  Age: 44 y.o.  Sex: male      Subjective:      Chief Complaint: Left lower extremity swelling and pain    HPI: [name] is a 44 y.o. male with past medical history of previous cerebrovascular accident status post TPA in 2014 presenting to the ER with complaints of left lower extremity swelling.  [name] currently drives 6 hours to [place] and back to [place] every weekend for work.  Over the past week, [name] has noticed left lower extremity swelling and pain.  Patient reports that on March 6 he was seen by a podiatrist for left heel pain and diagnosed with plantar fasciitis.  He was prescribed steroids.  [name] developed left calf pain approximately 1 week later and had follow-up outpatient visit with podiatrist.  Calf pain was thought to be secondary to muscular strain with recent plantar fasciitis.  He was prescribed naproxen.  Swelling developed approximately 1 week later.  Additional symptoms include mild shortness of breath.  [name] describes shortness of breath as needing to deeply inhale.  Additional complaints include elevated blood pressure today with a value of 138/94.  [name] reports that his blood pressure does not run that high.  This evening he presented to the ER for further treatment and evaluation.    Cerebrovascular accident 2014 use thought to be secondary to blood clot that passed through PFO.  [name] received TPA and underwent PFO repair.  Patient's father had a diagnosis of lymphoma and developed DVT.    Past medical history, past surgical history, family history, social history and home medication list was reviewed at the time of admission.  Wife states he can be reached at [phone].  There is a remote history of smoking and patient occasionally drinks alcohol on the weekends.  Mr. Grime denies illicit drug use.  He is a full  code.    Past Medical History:   Diagnosis Date   ??? 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 PFO (patent foramen ovale)     s/p repair     History reviewed. No pertinent surgical history.   Family History   Problem Relation Age of Onset   ??? Cancer Father    ??? Heart Disease Father    ??? Heart Disease Maternal Aunt    ??? Migraines Paternal Aunt    ??? Heart Disease Maternal Grandmother      Social History     Tobacco Use   ??? Smoking status: Never Smoker   ??? Smokeless tobacco: Never Used   Substance Use Topics   ??? Alcohol use: Yes     Comment: weekends      Prior to Admission medications    Not on File        No Known Allergies    Review of Systems:  Constitutional: Denies fevers, chills, fatigue, weakness, unexplained weight loss, night sweats.  Head, Eyes, Ears, Nose, Mouth, Throat: Denies nasal congestion, sore throat, rhinorrhea, earache, ringing of the ears, difficulty hearing, facial pain, facial swelling.  Respiratory: Positive for shortness of breath. Denies wheezing, cough, sputum production, hemoptysis. Denies use of oxygen at home.  Cardiovascular: Denies chest pain, irregular heart beat, racing pulse, lower extremity edema, dizziness, dyspnea on exertion, orthopnea. Denies lower extremity edema.   Gastrointestinal: Denies nausea, vomiting, diarrhea, constipation, abdominal  pain, loss of appetite, acid reflux, melena, hematochezia, change in bowel habits.  Endocrine: Denies intolerance to heat or cold. Denies polyuria, polydipsia, polyphagia. Denies recent weight changes.  Genitourinary: Denies increased urinary frequency, dysuria, hematuria, urinary incontinence, increased urinary frequency.  Integument/Breast: Denies rash, itching or new skin lesions.  Musculoskeletal: Positive for left calf pain and swelling. Denies joint swelling, joint pain, neck pain, back pain.  Neurological: Denies headaches, dizziness, confusion, tremors, numbness/tingling, paresthesias, weakness, problems with  balance, loss of consciousness.  Hematologic: Denies easy bleeding, easy bruising, lymphadenopathy.  Behavioral/Psychiatric: Denies anxiety, depression, increased irritability, mood swings, delusions, hallucination, SI/HI.      Objective:     Vitals:    06/07/19 2208 06/07/19 2238 06/08/19 0050   BP: (!) 147/90 (!) 159/97 (!) 154/92   Pulse: 76 68 63   Resp: '16 14 16   '$ Temp: 98 ??F (36.7 ??C)     SpO2: 100% 98% 95%   Weight: 113.4 kg (250 lb)     Height: 6' (1.829 m)          Physical Exam:  General: Alert and Oriented x 3. Cooperative and friendly. No acute distress. Nourished and well developed.   Head/Eyes: Normocephalic, atraumatic, EOMI, PERRLA   Nose/Mouth: Turbinates within normal limits, No drainage. Mucous membranes are moist.  Throat and Neck: Posterior pharynx without erythema or exudate. No masses, JVD, thyromegaly or lymphadenopathy appreciated. Cervical spine has good range of motion without pain.  Lungs: Clear to auscultation bilaterally without wheezes, rhonchi or crackles. Good air movement bilaterally. Symmetric chest rise with respirations.  Heart: Regular rate and rhythm. Normal S1/S2. No appreciated murmurs, rubs or gallops.    Abdomen: Soft, non-tender, non-distended. Bowel sounds present in all four quadrants. No masses appreciated.  Extremities: Positive for mild left calf tenderness. Left calf circumference if greater than right calf. Atraumatic. Able to move all extremities symmetrically. No abnormal bony protuberances appreciated.  Back: No pain with palpation over spinous processes or paraspinal musculature. No CVA tenderness.  Skin: Clean, dry and intact without appreciated lesions.   Neurologic: A&Ox3. Cranial nerves 2-12 are grossly intact. No focal deficits.   Psychiatric: Normal affect, normal thought process, good eye contact.     Recent Results (from the past 24 hour(s))   CBC WITH AUTOMATED DIFF    Collection Time: 06/07/19 11:15 PM   Result Value Ref Range    WBC 4.6 4.1 - 11.1  K/uL    RBC 4.94 4.10 - 5.70 M/uL    HGB 14.4 12.1 - 17.0 g/dL    HCT 45.6 36.6 - 50.3 %    MCV 92.3 80.0 - 99.0 FL    MCH 29.1 26.0 - 34.0 PG    MCHC 31.6 30.0 - 36.5 g/dL    RDW 13.6 11.5 - 14.5 %    PLATELET 151 150 - 400 K/uL    MPV 11.1 8.9 - 12.9 FL    NEUTROPHILS 59 32 - 75 %    LYMPHOCYTES 28 12 - 49 %    MONOCYTES 9 5 - 13 %    EOSINOPHILS 4 0 - 7 %    BASOPHILS 0 0 - 1 %    IMMATURE GRANULOCYTES 0 0.0 - 0.5 %    ABS. NEUTROPHILS 2.7 1.8 - 8.0 K/UL    ABS. LYMPHOCYTES 1.3 0.8 - 3.5 K/UL    ABS. MONOCYTES 0.4 0.0 - 1.0 K/UL    ABS. EOSINOPHILS 0.2 0.0 - 0.4 K/UL    ABS. BASOPHILS 0.0  0.0 - 0.1 K/UL    ABS. IMM. GRANS. 0.0 0.00 - 0.04 K/UL    DF AUTOMATED     METABOLIC PANEL, COMPREHENSIVE    Collection Time: 06/07/19 11:15 PM   Result Value Ref Range    Sodium 139 136 - 145 mmol/L    Potassium 3.5 3.5 - 5.1 mmol/L    Chloride 107 97 - 108 mmol/L    CO2 28 21 - 32 mmol/L    Anion gap 4 (L) 5 - 15 mmol/L    Glucose 154 (H) 65 - 100 mg/dL    BUN 15 6 - 20 mg/dL    Creatinine 1.07 0.70 - 1.30 mg/dL    BUN/Creatinine ratio 14 12 - 20      GFR est AA >60 >60 ml/min/1.18m    GFR est non-AA >60 >60 ml/min/1.72m   Calcium 8.6 8.5 - 10.1 mg/dL    Bilirubin, total 0.3 0.2 - 1.0 mg/dL    AST (SGOT) 29 15 - 37 U/L    ALT (SGPT) 42 12 - 78 U/L    Alk. phosphatase 84 45 - 117 U/L    Protein, total 7.0 6.4 - 8.2 g/dL    Albumin 3.4 (L) 3.5 - 5.0 g/dL    Globulin 3.6 2.0 - 4.0 g/dL    A-G Ratio 0.9 (L) 1.1 - 2.2     TROPONIN I    Collection Time: 06/07/19 11:15 PM   Result Value Ref Range    Troponin-I, Qt. <0.05 <0.05 ng/mL       XR Results (maximum last 3):  [facility]   XR CHEST SNGL V    Narrative Indication: Shortness of breath.    AP semiupright portable chest radiograph 2229 hours 07 June 2019. No  comparison.    Clear lungs.  Normal heart size.  No pleural effusion or pneumothorax.      Impression No acute finding.           CT Results (maximum last 3):  [facility]   CTA CHEST W OR W WO CONT    Narrative Indication: Shortness of breath. History of DVT.    Dose reduction: All CT scans done at this facility are performed using dose  reduction optimization techniques as appropriate to a performed exam including  the following: Automated exposure control, adjustments of the mA and/or kV  according to patient size, or use of iterative reconstruction technique.    CTA chest, PE protocol, 100 cc Isovue-370 IV, multiplanar standard and MIP  reformatting 06/08/2019. Correlation chest radiograph the same day.    Small emboli in right lower lobe pulmonary arteries. No right heart strain.  Normal thoracic aorta.  No pulmonary infiltrate.  Small hepatic cysts.  Partly included left renal cysts.      Impression Right lower lobe pulmonary emboli.    Report called to Dr. KaElly Modena:10 AM.       Assessment:     MiKAIDAN HARPSTERs a [age].o. male presenting to the ER with complaints of left lower calf pain and swelling with mild shortness of breath.  CT of the chest is positive for right lower lobe pulmonary embolism without right heart strain.     Plan:     1. Admit to telemetry bed.   2. Continue IV Heparin Drip.  3. Order lower extremity Duplex for possible lower extremity DVTs.     GI PPX: Diet ordered.  DVT PPX: Chemical PPX is not indicated while on IV Heparin. Hold SCDs with pending lower extremity duplex to rule out DVTs.    Signed By: Serita Butcher, MD     June 08, 2019

## 2019-06-08 NOTE — Progress Notes (Signed)
Admit skin check completed by this nurse and Chellen Tabamo,RN. No skin issues noted at this time.

## 2019-06-08 NOTE — Progress Notes (Signed)
*ATTENTION:  This note has been created by a medical student for educational purposes only.  Please do not refer to the content of this note for clinical decision-making, billing, or other purposes.  Please see attending physician's note to obtain clinical information on this patient.*       Hospitalist Progress Note    Subjective:   Gregory Martinez is a 44 year old male with a medical history of previous thrombus for which he was treated with PFO repair and TPA in 2014 presenting to the ER with complaints of left lower extremity swelling.  Gregory Martinez currently drives 6 hours to [place] and back to [place] every weekend for work.  Over the past week, Gregory Martinez has noticed left lower extremity swelling and pain.  Additional symptoms include mild shortness of breath.    This morning in follow up he is alert awake. No complaints this morning.     .      Review of Systems  Constitutional: Denies fevers, chills, fatigue, weakness, unexplained weight loss, night sweats.  Head, Eyes, Ears, Nose, Mouth, Throat: Denies nasal congestion, sore throat, rhinorrhea, earache, ringing of the ears, difficulty hearing, facial pain, facial swelling.  Respiratory: Positive for shortness of breath. Denies wheezing, cough, sputum production, hemoptysis. Denies use of oxygen at home.  Cardiovascular: Denies chest pain, irregular heart beat, racing pulse, lower extremity edema, dizziness, dyspnea on exertion, orthopnea. Denies lower extremity edema.   Gastrointestinal: Denies nausea, vomiting, diarrhea, constipation, abdominal pain, loss of appetite, acid reflux, melena, hematochezia, change in bowel habits.  Endocrine: Denies intolerance to heat or cold. Denies polyuria, polydipsia, polyphagia. Denies recent weight changes.  Genitourinary: Denies increased urinary frequency, dysuria, hematuria, urinary incontinence, increased urinary frequency.  Integument/Breast: Denies rash, itching or new skin lesions.  Musculoskeletal: Positive  for left calf pain and swelling. Denies joint swelling, joint pain, neck pain, back pain.  Neurological: Denies headaches, dizziness, confusion, tremors, numbness/tingling, paresthesias, weakness, problems with balance, loss of consciousness.  Hematologic: Denies easy bleeding, easy bruising, lymphadenopathy.  Behavioral/Psychiatric: Denies anxiety, depression, increased irritability, mood swings, delusions, hallucination, SI/HI.      Objective:     Visit Vitals  BP 130/85 (BP 1 Location: Right lower arm, BP Patient Position: At rest)   Pulse (!) 56   Temp 98 F (36.7 C)   Resp 14   Ht 6' (1.829 m)   Wt 250 lb (113.4 kg)   SpO2 99%   BMI 33.91 kg/m      O2 Device: None (Room air)    Temp (24hrs), Avg:98 F (36.7 C), Min:98 F (36.7 C), Max:98 F (36.7 C)      No intake/output data recorded.  No intake/output data recorded.    Visit Vitals  BP 130/85 (BP 1 Location: Right lower arm, BP Patient Position: At rest)   Pulse (!) 56   Temp 98 F (36.7 C)   Resp 14   Ht 6' (1.829 m)   Wt 250 lb (113.4 kg)   SpO2 99%   BMI 33.91 kg/m     General:  Alert, cooperative, no distress, appears stated age.   Head:  Normocephalic, without obvious abnormality, atraumatic.   Eyes:  Conjunctivae/corneas clear. PERRL, EOMs intact. Fundi benign   Ears:  Normal TMs and external ear canals both ears.   Nose: Nares normal. Septum midline. Mucosa normal. No drainage or sinus tenderness.   Throat: Lips, mucosa, and tongue normal. Teeth and gums normal.   Neck: Supple, symmetrical, trachea midline,  no adenopathy, thyroid: no enlargement/tenderness/nodules, no carotid bruit and no JVD.   Back:   Symmetric, no curvature. ROM normal. No CVA tenderness.   Lungs:   Clear to auscultation bilaterally.   Chest wall:  No tenderness or deformity.   Heart:  Regular rate and rhythm, S1, S2 normal, no murmur, click, rub or gallop.   Abdomen:   Soft, non-tender. Bowel sounds normal. No masses,  No organomegaly.   Genitalia:  Normal male without  lesion, discharge or tenderness.   Rectal:  Normal tone, normal prostate, no masses or tenderness  Guaiac negative stool.   Extremities: Left calf edematous and red   Pulses: 2+ and symmetric all extremities.   Skin: Skin color, texture, turgor normal. No rashes or lesions   Lymph nodes: Cervical, supraclavicular, and axillary nodes normal.   Neurologic: CNII-XII intact. Normal strength, sensation and reflexes throughout.           Data Review    Recent Results (from the past 24 hour(s))   CBC WITH AUTOMATED DIFF    Collection Time: 06/07/19 11:15 PM   Result Value Ref Range    WBC 4.6 4.1 - 11.1 K/uL    RBC 4.94 4.10 - 5.70 M/uL    HGB 14.4 12.1 - 17.0 g/dL    HCT 95.2 84.1 - 32.4 %    MCV 92.3 80.0 - 99.0 FL    MCH 29.1 26.0 - 34.0 PG    MCHC 31.6 30.0 - 36.5 g/dL    RDW 40.1 02.7 - 25.3 %    PLATELET 151 150 - 400 K/uL    MPV 11.1 8.9 - 12.9 FL    NEUTROPHILS 59 32 - 75 %    LYMPHOCYTES 28 12 - 49 %    MONOCYTES 9 5 - 13 %    EOSINOPHILS 4 0 - 7 %    BASOPHILS 0 0 - 1 %    IMMATURE GRANULOCYTES 0 0.0 - 0.5 %    ABS. NEUTROPHILS 2.7 1.8 - 8.0 K/UL    ABS. LYMPHOCYTES 1.3 0.8 - 3.5 K/UL    ABS. MONOCYTES 0.4 0.0 - 1.0 K/UL    ABS. EOSINOPHILS 0.2 0.0 - 0.4 K/UL    ABS. BASOPHILS 0.0 0.0 - 0.1 K/UL    ABS. IMM. GRANS. 0.0 0.00 - 0.04 K/UL    DF AUTOMATED     METABOLIC PANEL, COMPREHENSIVE    Collection Time: 06/07/19 11:15 PM   Result Value Ref Range    Sodium 139 136 - 145 mmol/L    Potassium 3.5 3.5 - 5.1 mmol/L    Chloride 107 97 - 108 mmol/L    CO2 28 21 - 32 mmol/L    Anion gap 4 (L) 5 - 15 mmol/L    Glucose 154 (H) 65 - 100 mg/dL    BUN 15 6 - 20 mg/dL    Creatinine 6.64 4.03 - 1.30 mg/dL    BUN/Creatinine ratio 14 12 - 20      GFR est AA >60 >60 ml/min/1.53m2    GFR est non-AA >60 >60 ml/min/1.30m2    Calcium 8.6 8.5 - 10.1 mg/dL    Bilirubin, total 0.3 0.2 - 1.0 mg/dL    AST (SGOT) 29 15 - 37 U/L    ALT (SGPT) 42 12 - 78 U/L    Alk. phosphatase 84 45 - 117 U/L    Protein, total 7.0 6.4 - 8.2 g/dL    Albumin  3.4 (L) 3.5 - 5.0 g/dL  Globulin 3.6 2.0 - 4.0 g/dL    A-G Ratio 0.9 (L) 1.1 - 2.2     TROPONIN I    Collection Time: 06/07/19 11:15 PM   Result Value Ref Range    Troponin-I, Qt. <0.05 <0.05 ng/mL   TROPONIN I    Collection Time: 06/08/19  5:30 AM   Result Value Ref Range    Troponin-I, Qt. <0.05 <0.05 ng/mL   PTT    Collection Time: 06/08/19  5:30 AM   Result Value Ref Range    aPTT 32.0 23.0 - 35.7 sec    aPTT, therapeutic range   68 - 109 sec   CBC WITH AUTOMATED DIFF    Collection Time: 06/08/19  5:30 AM   Result Value Ref Range    WBC 3.8 (L) 4.1 - 11.1 K/uL    RBC 4.86 4.10 - 5.70 M/uL    HGB 14.1 12.1 - 17.0 g/dL    HCT 16.1 09.6 - 04.5 %    MCV 93.4 80.0 - 99.0 FL    MCH 29.0 26.0 - 34.0 PG    MCHC 31.1 30.0 - 36.5 g/dL    RDW 40.9 81.1 - 91.4 %    PLATELET 152 150 - 400 K/uL    MPV 11.4 8.9 - 12.9 FL    NEUTROPHILS 53 32 - 75 %    LYMPHOCYTES 35 12 - 49 %    MONOCYTES 7 5 - 13 %    EOSINOPHILS 5 0 - 7 %    BASOPHILS 0 0 - 1 %    IMMATURE GRANULOCYTES 0 0.0 - 0.5 %    ABS. NEUTROPHILS 2.0 1.8 - 8.0 K/UL    ABS. LYMPHOCYTES 1.3 0.8 - 3.5 K/UL    ABS. MONOCYTES 0.3 0.0 - 1.0 K/UL    ABS. EOSINOPHILS 0.2 0.0 - 0.4 K/UL    ABS. BASOPHILS 0.0 0.0 - 0.1 K/UL    ABS. IMM. GRANS. 0.0 0.00 - 0.04 K/UL    DF AUTOMATED         Current Facility-Administered Medications   Medication Dose Route Frequency   . heparin 25,000 units in D5W 250 ml infusion  18-36 Units/kg/hr (Adjusted) IntraVENous TITRATE   . sodium chloride (NS) flush 5-40 mL  5-40 mL IntraVENous Q8H   . sodium chloride (NS) flush 5-40 mL  5-40 mL IntraVENous PRN   . acetaminophen (TYLENOL) tablet 650 mg  650 mg Oral Q6H PRN    Or   . acetaminophen (TYLENOL) suppository 650 mg  650 mg Rectal Q6H PRN   . polyethylene glycol (MIRALAX) packet 17 g  17 g Oral DAILY PRN   . promethazine (PHENERGAN) tablet 12.5 mg  12.5 mg Oral Q6H PRN    Or   . ondansetron (ZOFRAN) injection 4 mg  4 mg IntraVENous Q6H PRN   . heparin (porcine) 1,000 unit/mL injection 7,350  Units  80 Units/kg (Adjusted) IntraVENous PRN    Or   . heparin (porcine) 1,000 unit/mL injection 3,680 Units  40 Units/kg (Adjusted) IntraVENous PRN         Assessment/Plan:     Active Problems:    Pulmonary embolism (HCC) (06/08/2019)      CT of the chest is positive for right lower lobe pulmonary embolism without right heart strain.     Waiting for doppler study of left lower extremity. If negative, patient is stable for discharge on oral anticoagulant.         Total time spent with patient: 15  minutes.

## 2019-06-08 NOTE — ACP (Advance Care Planning) (Signed)
Advance Care Planning     Advance Care Planning (ACP) Physician/NP/PA Conversation      Date of Conversation: 06/07/2019  Conducted with: Patient with Decision Making Capacity    Healthcare Decision Maker:     Click here to complete HealthCare Decision Makers including selection of the Healthcare Decision Maker Relationship (ie "Primary")  Today we documented Decision Maker(s) consistent with Legal Next of Kin hierarchy.    Care Preferences:    Hospitalization: "If your health worsens and it becomes clear that your chance of recovery is unlikely, what would be your preference regarding hospitalization?"  The patient would prefer hospitalization.    Ventilation: "If you were unable to breathe on your own and your chance of recovery was unlikely, what would be your preference about the use of a ventilator (breathing machine) if it was available to you?"   The patient would desire the use of a ventilator.    Resuscitation: "In the event your heart stopped as a result of an underlying serious health condition, would you want attempts to be made to restart your heart, or would you prefer a natural death?"   Yes, attempt to resuscitate.    Additional topics discussed: resuscitation preferences    Conversation Outcomes / Follow-Up Plan:   ACP complete - no further action today  Reviewed DNR/DNI and patient elects Full Code (Attempt Resuscitation)     Length of Voluntary ACP Conversation in minutes:  <16 minutes (Non-Billable)    Janira Mandell C Lemon Sternberg, MD

## 2019-06-08 NOTE — Consults (Signed)
Hematology Oncology Consultation    Reason for consult: DVT    Admitting Diagnosis: Pulmonary embolism ([place]) [I26.99]    Impression:   1. 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 Jehova witness  -declining transfusions    3. Prior hx of CVA    Ok to release once medically stable    Discussion: Gregory Martinez is a  44 y.o. year old seen in consultation at the request of Dr. Jamelle Rushing for recurrent thrombosis.  He presented for evaluation of acute leg pain and progresssive shortness of breath.  In the ER he underwent imaging which showed recurrent thrombosis.  He has had a prior history of thrombosis and so was not surprised to learn of this.  He was started on a heparin drip.  No issues with anticoagulation previously.  No known contraindications to anticoagulation.  He had a prior cva from thromboembolic disease in 0981. Since then he has underwent PFO closure.  He was no longer on anticoagulation at the time of event.  He does have prolonged immobility as a consequence of long drives  Imaging:    CT scans reviewed    Labs:    Recent Results (from the past 24 hour(s))   CBC WITH AUTOMATED DIFF    Collection Time: 06/07/19 11:15 PM   Result Value Ref Range    WBC 4.6 4.1 - 11.1 K/uL    RBC 4.94 4.10 - 5.70 M/uL    HGB 14.4 12.1 - 17.0 g/dL    HCT 45.6 36.6 - 50.3 %    MCV 92.3 80.0 - 99.0 FL    MCH 29.1 26.0 - 34.0 PG    MCHC 31.6 30.0 - 36.5 g/dL    RDW 13.6 11.5 - 14.5 %    PLATELET 151 150 - 400 K/uL    MPV 11.1 8.9 - 12.9 FL    NEUTROPHILS 59 32 - 75 %    LYMPHOCYTES 28 12 - 49 %    MONOCYTES 9 5 - 13 %    EOSINOPHILS 4 0 - 7 %    BASOPHILS 0 0 - 1 %    IMMATURE GRANULOCYTES 0 0.0 - 0.5 %    ABS. NEUTROPHILS 2.7 1.8 - 8.0 K/UL    ABS.  LYMPHOCYTES 1.3 0.8 - 3.5 K/UL    ABS. MONOCYTES 0.4 0.0 - 1.0 K/UL    ABS. EOSINOPHILS 0.2 0.0 - 0.4 K/UL    ABS. BASOPHILS 0.0 0.0 - 0.1 K/UL    ABS. IMM. GRANS. 0.0 0.00 - 0.04 K/UL    DF AUTOMATED     METABOLIC PANEL, COMPREHENSIVE    Collection Time: 06/07/19 11:15 PM   Result Value Ref Range    Sodium 139 136 - 145 mmol/L    Potassium 3.5 3.5 - 5.1 mmol/L    Chloride 107 97 - 108 mmol/L    CO2 28 21 - 32 mmol/L    Anion gap 4 (L) 5 - 15 mmol/L    Glucose 154 (H) 65 - 100 mg/dL    BUN 15 6 - 20 mg/dL    Creatinine 1.07 0.70 - 1.30 mg/dL    BUN/Creatinine ratio 14 12 - 20      GFR est AA >60 >60  ml/min/1.55m    GFR est non-AA >60 >60 ml/min/1.754m   Calcium 8.6 8.5 - 10.1 mg/dL    Bilirubin, total 0.3 0.2 - 1.0 mg/dL    AST (SGOT) 29 15 - 37 U/L    ALT (SGPT) 42 12 - 78 U/L    Alk. phosphatase 84 45 - 117 U/L    Protein, total 7.0 6.4 - 8.2 g/dL    Albumin 3.4 (L) 3.5 - 5.0 g/dL    Globulin 3.6 2.0 - 4.0 g/dL    A-G Ratio 0.9 (L) 1.1 - 2.2     TROPONIN I    Collection Time: 06/07/19 11:15 PM   Result Value Ref Range    Troponin-I, Qt. <0.05 <0.05 ng/mL   TROPONIN I    Collection Time: 06/08/19  5:30 AM   Result Value Ref Range    Troponin-I, Qt. <0.05 <0.05 ng/mL   PTT    Collection Time: 06/08/19  5:30 AM   Result Value Ref Range    aPTT 32.0 23.0 - 35.7 sec    aPTT, therapeutic range   68 - 109 sec   CBC WITH AUTOMATED DIFF    Collection Time: 06/08/19  5:30 AM   Result Value Ref Range    WBC 3.8 (L) 4.1 - 11.1 K/uL    RBC 4.86 4.10 - 5.70 M/uL    HGB 14.1 12.1 - 17.0 g/dL    HCT 45.4 36.6 - 50.3 %    MCV 93.4 80.0 - 99.0 FL    MCH 29.0 26.0 - 34.0 PG    MCHC 31.1 30.0 - 36.5 g/dL    RDW 13.6 11.5 - 14.5 %    PLATELET 152 150 - 400 K/uL    MPV 11.4 8.9 - 12.9 FL    NEUTROPHILS 53 32 - 75 %    LYMPHOCYTES 35 12 - 49 %    MONOCYTES 7 5 - 13 %    EOSINOPHILS 5 0 - 7 %    BASOPHILS 0 0 - 1 %    IMMATURE GRANULOCYTES 0 0.0 - 0.5 %    ABS. NEUTROPHILS 2.0 1.8 - 8.0 K/UL    ABS. LYMPHOCYTES 1.3 0.8 - 3.5 K/UL     ABS. MONOCYTES 0.3 0.0 - 1.0 K/UL    ABS. EOSINOPHILS 0.2 0.0 - 0.4 K/UL    ABS. BASOPHILS 0.0 0.0 - 0.1 K/UL    ABS. IMM. GRANS. 0.0 0.00 - 0.04 K/UL    DF AUTOMATED         History:  Past Medical History:   Diagnosis Date   ??? [place]   ??? PFO (patent foramen ovale)     s/p repair      History reviewed. No pertinent surgical history.   Prior to Admission medications    Not on File     No Known Allergies   Social History     Tobacco Use   ??? Smoking status: Never Smoker   ??? Smokeless tobacco: Never Used   Substance Use Topics   ??? Alcohol use: Yes     Comment: weekends      Family History   Problem Relation Age of Onset   ??? Cancer Father    ??? Heart Disease Father    ??? Heart Disease Maternal Aunt    ??? Migraines Paternal Aunt    ??? Heart Disease Maternal Grandmother         Current Medications:  Current Facility-Administered  Medications   Medication Dose Route Frequency   ??? heparin 25,000 units in D5W 250 ml infusion  18-36 Units/kg/hr (Adjusted) IntraVENous TITRATE   ??? sodium chloride (NS) flush 5-40 mL  5-40 mL IntraVENous Q8H   ??? sodium chloride (NS) flush 5-40 mL  5-40 mL IntraVENous PRN   ??? acetaminophen (TYLENOL) tablet 650 mg  650 mg Oral Q6H PRN    Or   ??? acetaminophen (TYLENOL) suppository 650 mg  650 mg Rectal Q6H PRN   ??? polyethylene glycol (MIRALAX) packet 17 g  17 g Oral DAILY PRN   ??? promethazine (PHENERGAN) tablet 12.5 mg  12.5 mg Oral Q6H PRN    Or   ??? ondansetron (ZOFRAN) injection 4 mg  4 mg IntraVENous Q6H PRN   ??? heparin (porcine) 1,000 unit/mL injection 7,350 Units  80 Units/kg (Adjusted) IntraVENous PRN    Or   ??? heparin (porcine) 1,000 unit/mL injection 3,680 Units  40 Units/kg (Adjusted) IntraVENous PRN         Review of Systems:  Constitutional No fevers, chills, night sweats, excessive fatigue or weight loss.   Allergic/Immunologic No recent allergic reactions   Eyes No significant visual difficulties. No diplopia.   ENMT No problems with  hearing, no sore throat, no sinus drainage.   Endocrine No hot flashes or night sweats. No cold intolerance, polyuria, or polydipsia   Hematologic/Lymphatic No easy bruising or bleeding.  The patient denies any tender or palpable lymph nodes   Breasts No abnormal masses of breast, nipple discharge or pain.   Respiratory No dyspnea on exertion, orthopnea, chest pain, cough or hemoptysis.   Cardiovascular No anginal chest pain, irregular heart beat, tachycardia, palpitations or orthopnea.   Gastrointestinal No nausea, vomiting, diarrhea, constipation, cramping, dysphagia, reflux, heartburn, GI bleeding, or early satiety.  No change in bowel habits.   Genitourinary (M) No hematuria, dysuria, increased frequency, urgency, hesitancy or incontinence.   Musculoskeletal No joint pain, swelling or redness. No decreased range of motion.   Integumentary No chronic rashes, inflammation, ulcerations, pruritus, petechiae, purpura, ecchymoses, or skin changes.   Neurologic No headache, blurred vision, and no areas of focal weakness or numbness. Normal gait. No sensory problems.   Psychiatric No insomnia, depression, mania or mood swings.  No psychotropic drugs.       Physical Exam:  Constitutional Alert, cooperative, oriented. Mood and affect appropriate. Appears close to chronological age. Well nourished. Well developed.   Head Normocephalic; no scars   Eyes Conjunctivae and sclerae are clear and without icterus. Pupils are reactive and equal.   ENMT Sinuses are nontender.  No oral exudates, ulcers, masses, thrush or mucositis. Oropharynx clear.  Tongue normal.   Neck Supple without masses or thyromegaly. No jugular venous distension.   Hematologic/Lymphatic No petechiae or purpura.  No tender or palpable lymph nodes in the cervical, supraclavicular, axillary or inguinal area.   Respiratory Lungs are clear to auscultation without rhonchi or wheezing.   Cardiovascular Regular rate and rhythm of heart without murmurs, gallops or  rubs.   Chest / Line Site Chest is symmetric with no chest wall deformities.   Abdomen Non-tender, non-distended, no masses, ascites or hepatosplenomegaly. Good bowel sounds. No guarding or rebound tenderness. No pulsatile masses.   Musculoskeletal No tenderness or swelling, normal range of motion without obvious weakness.   Extremities No visible deformities, no cyanosis, clubbing or edema.    Skin No rashes, scars, or lesions suggestive of malignancy. No petechiae, purpura, or ecchymoses. No excoriations.  Neurologic No sensory or motor deficits, normal cerebellar function, normal gait, cranial nerves intact.   Psychiatric Alert and oriented times three. Coherent speech. Verbalizes understanding of our discussions today.

## 2019-06-08 NOTE — ED Notes (Signed)
 TRANSFER - OUT REPORT:    Verbal report given to Olam, RN (name) on THARON BOMAR  being transferred to 2E (unit) for routine progression of care       Report consisted of patient's Situation, Background, Assessment and   Recommendations(SBAR).     Information from the following report(s) SBAR, ED Summary, Intake/Output, MAR, Recent Results and Cardiac Rhythm NSR was reviewed with the receiving nurse.    Lines:   Peripheral IV 06/07/19 Left Antecubital (Active)       Peripheral IV 06/08/19 Right Basilic (Active)        Opportunity for questions and clarification was provided.      Patient transported with:   Monitor  Tech

## 2019-06-08 NOTE — Progress Notes (Unsigned)
Called lab to follow up on PTT  blood draw informed this nurse that phlebotomist is on the floor to draw labs. will continue to follow up.

## 2019-06-09 LAB — CBC WITH AUTO DIFFERENTIAL
Basophils %: 0 % (ref 0–1)
Basophils Absolute: 0 10*3/uL (ref 0.0–0.1)
Eosinophils %: 5 % (ref 0–7)
Eosinophils Absolute: 0.2 10*3/uL (ref 0.0–0.4)
Granulocyte Absolute Count: 0 10*3/uL (ref 0.00–0.04)
Hematocrit: 48 % (ref 36.6–50.3)
Hemoglobin: 15.3 g/dL (ref 12.1–17.0)
Immature Granulocytes: 0 % (ref 0.0–0.5)
Lymphocytes %: 32 % (ref 12–49)
Lymphocytes Absolute: 1.6 10*3/uL (ref 0.8–3.5)
MCH: 29.4 PG (ref 26.0–34.0)
MCHC: 31.9 g/dL (ref 30.0–36.5)
MCV: 92.1 FL (ref 80.0–99.0)
MPV: 12.2 FL (ref 8.9–12.9)
Monocytes %: 9 % (ref 5–13)
Monocytes Absolute: 0.4 10*3/uL (ref 0.0–1.0)
Neutrophils %: 54 % (ref 32–75)
Neutrophils Absolute: 2.6 10*3/uL (ref 1.8–8.0)
Platelets: 164 10*3/uL (ref 150–400)
RBC: 5.21 M/uL (ref 4.10–5.70)
RDW: 14.1 % (ref 11.5–14.5)
WBC: 4.8 10*3/uL (ref 4.1–11.1)

## 2019-06-09 LAB — EKG 12-LEAD
Atrial Rate: 80 {beats}/min
Diagnosis: NORMAL
P Axis: 48 degrees
P-R Interval: 150 ms
Q-T Interval: 378 ms
QRS Duration: 72 ms
QTc Calculation (Bazett): 435 ms
R Axis: -28 degrees
T Axis: 56 degrees
Ventricular Rate: 80 {beats}/min

## 2019-06-09 LAB — APTT: aPTT: 147.7 s (ref 23.0–35.7)

## 2019-06-09 LAB — CBC WITH AUTOMATED DIFF
ABS. BASOPHILS: 0 10*3/uL (ref 0.0–0.1)
ABS. EOSINOPHILS: 0.2 10*3/uL (ref 0.0–0.4)
ABS. IMM. GRANS.: 0 10*3/uL (ref 0.00–0.04)
ABS. LYMPHOCYTES: 1.6 10*3/uL (ref 0.8–3.5)
ABS. MONOCYTES: 0.4 10*3/uL (ref 0.0–1.0)
ABS. NEUTROPHILS: 2.6 10*3/uL (ref 1.8–8.0)
BASOPHILS: 0 % (ref 0–1)
EOSINOPHILS: 5 % (ref 0–7)
HCT: 48 % (ref 36.6–50.3)
HGB: 15.3 g/dL (ref 12.1–17.0)
IMMATURE GRANULOCYTES: 0 % (ref 0.0–0.5)
LYMPHOCYTES: 32 % (ref 12–49)
MCH: 29.4 PG (ref 26.0–34.0)
MCHC: 31.9 g/dL (ref 30.0–36.5)
MCV: 92.1 FL (ref 80.0–99.0)
MONOCYTES: 9 % (ref 5–13)
MPV: 12.2 FL (ref 8.9–12.9)
NEUTROPHILS: 54 % (ref 32–75)
PLATELET: 164 10*3/uL (ref 150–400)
RBC: 5.21 M/uL (ref 4.10–5.70)
RDW: 14.1 % (ref 11.5–14.5)
WBC: 4.8 10*3/uL (ref 4.1–11.1)

## 2019-06-09 LAB — EKG, 12 LEAD, INITIAL
Atrial Rate: 80 {beats}/min
Calculated P Axis: 48 degrees
Calculated R Axis: -28 degrees
Calculated T Axis: 56 degrees
Diagnosis: NORMAL
P-R Interval: 150 ms
Q-T Interval: 378 ms
QRS Duration: 72 ms
QTC Calculation (Bezet): 435 ms
Ventricular Rate: 80 {beats}/min

## 2019-06-09 LAB — PTT: aPTT: 147.7 s — CR (ref 23.0–35.7)

## 2019-06-09 MED ORDER — APIXABAN 5 MG TABLET
5 mg | ORAL_TABLET | Freq: Two times a day (BID) | ORAL | 0 refills | Status: AC
Start: 2019-06-09 — End: 2019-06-16

## 2019-06-09 MED ORDER — APIXABAN 5 MG TABLET
5 mg | Freq: Two times a day (BID) | ORAL | Status: DC
Start: 2019-06-09 — End: 2019-06-09
  Administered 2019-06-09: 14:00:00 via ORAL

## 2019-06-09 MED ORDER — APIXABAN 5 MG TABLET
5 mg | ORAL_TABLET | Freq: Two times a day (BID) | ORAL | 1 refills | Status: AC
Start: 2019-06-09 — End: 2019-07-09

## 2019-06-09 MED ORDER — LISINOPRIL 10 MG TAB
10 mg | ORAL_TABLET | Freq: Every day | ORAL | 1 refills | Status: AC
Start: 2019-06-09 — End: 2019-07-10

## 2019-06-09 MED FILL — LISINOPRIL 10 MG TAB: 10 mg | ORAL | Qty: 1

## 2019-06-09 MED FILL — ELIQUIS 5 MG TABLET: 5 mg | ORAL | Qty: 2

## 2019-06-09 NOTE — Other (Signed)
Discharge instructions reviewed with patient. Patient confirmed name and DOB. Follow up appointments reviewed with patient. Prescriptions e-presribed to pharmacy and reviewed with patient. All questions answered at this time. Both IVs were taken out and telemetry box was take off the patient. Discharge plan of care/case management plan validated with provider discharge order. Patient is being discharged to home/self-care. He will drive himself home.

## 2019-06-09 NOTE — Progress Notes (Signed)
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  Treated with IV heparin now transitioned to Eliquis 10 mg twice daily.  No major interval problems , planning for discharge today.    Subjective:   Patient has no new complaints feels his breathing is improved.  No bleeding or bruising complications no chest pain, shortness of breath today      Current Facility-Administered Medications   Medication Dose Route Frequency   ??? apixaban (ELIQUIS) tablet 10 mg  10 mg Oral Q12H   ??? sodium chloride (NS) flush 5-40 mL  5-40 mL IntraVENous Q8H   ??? sodium chloride (NS) flush 5-40 mL  5-40 mL IntraVENous PRN   ??? acetaminophen (TYLENOL) tablet 650 mg  650 mg Oral Q6H PRN    Or   ??? acetaminophen (TYLENOL) suppository 650 mg  650 mg Rectal Q6H PRN   ??? polyethylene glycol (MIRALAX) packet 17 g  17 g Oral DAILY PRN   ??? promethazine (PHENERGAN) tablet 12.5 mg  12.5 mg Oral Q6H PRN    Or   ??? ondansetron (ZOFRAN) injection 4 mg  4 mg IntraVENous Q6H PRN   ??? lisinopriL (PRINIVIL, ZESTRIL) tablet 10 mg  10 mg Oral DAILY        Review of Systems:    Constitutional No fevers, chills, night sweats, excessive fatigue or weight loss.   Allergic/Immunologic No recent allergic reactions   Eyes No significant visual difficulties. No diplopia.   ENMT No problems with hearing, no sore throat, no sinus drainage.   Endocrine No hot flashes or night sweats. No cold intolerance, polyuria, or polydipsia   Hematologic/Lymphatic No easy bruising or bleeding.  The patient denies any tender or palpable lymph nodes   Breasts No abnormal masses of breast, nipple discharge or pain.   Respiratory No dyspnea on exertion, orthopnea, chest pain, cough or hemoptysis.   Cardiovascular No anginal chest pain, irregular heart beat, tachycardia, palpitations or orthopnea.   Gastrointestinal No nausea, vomiting, diarrhea, constipation, cramping, dysphagia, reflux, heartburn, GI  bleeding, or early satiety.  No change in bowel habits.   Genitourinary (M) No hematuria, dysuria, increased frequency, urgency, hesitancy or incontinence.   Musculoskeletal No joint pain, swelling or redness. No decreased range of motion.   Integumentary No chronic rashes, inflammation, ulcerations, pruritus, petechiae, purpura, ecchymoses, or skin changes.   Neurologic No headache, blurred vision, and no areas of focal weakness or numbness. Normal gait. No sensory problems.   Psychiatric No insomnia, depression, mania or mood swings.  No psychotropic drugs.     Objective:     Patient Vitals for the past 8 hrs:   BP Temp Pulse Resp SpO2   06/09/19 1056 (!) 141/97 98.1 ??F (36.7 ??C) 79 16 97 %   06/09/19 0800 ??? ??? 70 ??? ???   06/09/19 0725 130/89 97.6 ??F (36.4 ??C) 80 18 ???       Temp (24hrs), Avg:97.9 ??F (36.6 ??C), Min:97.6 ??F (36.4 ??C), Max:98.4 ??F (36.9 ??C)      Physical Exam:    Constitutional  African-American male ,alert, cooperative, oriented. Mood and affect appropriate. Appears close to chronological age. Well nourished. Well developed.   Head Normocephalic; no scars   Eyes Conjunctivae and sclerae are clear and without icterus. Pupils are reactive and equal.   ENMT Sinuses are nontender.  No oral exudates, ulcers, masses, thrush or mucositis. Oropharynx clear.  Tongue normal.   Neck Supple without masses or thyromegaly. No jugular venous distension.   Hematologic/Lymphatic No petechiae or purpura.  No tender or palpable lymph nodes in the cervical, supraclavicular, axillary or inguinal area.   Respiratory Lungs are clear to auscultation without rhonchi or wheezing.   Cardiovascular Regular rate and rhythm of heart without murmurs, gallops or rubs.   Chest / Line Site Chest is symmetric with no chest wall deformities.   Abdomen Non-tender, non-distended, no masses, ascites or hepatosplenomegaly. Good bowel sounds. No guarding or rebound tenderness. No pulsatile masses.   Musculoskeletal No tenderness or swelling,  normal range of motion without obvious weakness.   Extremities No visible deformities, no cyanosis, clubbing or edema.    Skin No rashes, scars, or lesions suggestive of malignancy. No petechiae, purpura, or ecchymoses. No excoriations.    Neurologic No sensory or motor deficits, normal cerebellar function, normal gait, cranial nerves intact.   Psychiatric Alert and oriented times three. Coherent speech. Verbalizes understanding of our discussions today.     Lab/Data Review:  Recent Labs     06/09/19  0345 06/08/19  0530 06/07/19  2315   WBC 4.8 3.8* 4.6   HGB 15.3 14.1 14.4   HCT 48.0 45.4 45.6   PLT 164 152 151     Recent Labs     06/07/19  2315   NA 139   K 3.5   CL 107   CO2 28   GLU 154*   BUN 15   CREA 1.07   CA 8.6   ALB 3.4*   TBILI 0.3   ALT 42     No results for input(s): PH, PCO2, PO2, HCO3, FIO2 in the last 72 hours.      Radiology:   Xr Chest Sngl V    Result Date: 06/07/2019  No acute finding.     Cta Chest W Or W Wo Cont    Result Date: 06/08/2019  Right lower lobe pulmonary emboli. Report called to Dr. Karkavandian 2:10 AM.           Assessment /Plan:     Active Problems:    Pulmonary embolism ([facility]) (06/08/2019)      1. Recurrent DVT/PE:   43-year-old man with a history of CVA 2014, DVT and PE 2014 complaining of left lower leg pain and swelling.  Doppler now showed extensive right leg thrombus involving distal femoral vein, popliteal vein and the gastrocnemius vein and mid to proximal posterior tibial vein and peroneal vein.    -Likely he will need lifelong anticoagulation in view of his prior VTE s and now with extensive thrombus.  -Agree with Eliquis 10 twice daily for a week followed by 5 twice daily.    Hypercoagulable work-up has been ordered results are pending.    Patient is instructed to follow-up with Dr. Langer in about 3 to 4 weeks.    Tylan Briguglio R Artemis Koller, MD  06/09/2019

## 2019-06-09 NOTE — Discharge Summary (Signed)
Physician Discharge Summary     Patient ID:    Gregory Martinez  [id number]  44 y.o.  1975/07/15    Admit date: 06/07/2019    Discharge date : 06/09/2019    Chronic Diagnoses:    Problem List as of 06/09/2019 Date Reviewed: Jun 08, 2018          Codes Class Noted - Resolved    Pulmonary embolism ([place]) ICD-10-CM: I26.99  ICD-9-CM: 415.19  06/08/2019 - Present        Severe obesity ([place]) ICD-10-CM: E66.01  ICD-9-CM: 278.01  03/01/2018 - Present          22    Final Diagnoses:   Pulmonary embolism ([place]) [I26.99]   Acute left leg DVT  Recurrent DVT    Reason for Hospitalization:  Left leg pain and shortness of breath      Hospital Course:     Per H and P,"44 y.o. male with past medical history of previous cerebrovascular accident status post TPA in 2014 presenting to the ER with complaints of left lower extremity swelling.  Mr. Switalski currently drives 6 hours to [place] and back to [place] every weekend for work.  Over the past week, Mr. Lacher has noticed left lower extremity swelling and pain.  Patient reports that on March 6 he was seen by a podiatrist for left heel pain and diagnosed with plantar fasciitis.  He was prescribed steroids.  Mr. Monnier developed left calf pain approximately 1 week later and had follow-up outpatient visit with podiatrist.  Calf pain was thought to be secondary to muscular strain with recent plantar fasciitis.  He was prescribed naproxen.  Swelling developed approximately 1 week later.  Additional symptoms include mild shortness of breath.  Mr. Albers describes shortness of breath as needing to deeply inhale.  Additional complaints include elevated blood pressure today with a value of 138/94"  Patient was admitted to medical telemetry floor and treated with IV heparin for acute DVT/PE.  Thrombophilic work-up obtain.  Seen in consultation by hematologist and recommended lifelong anticoagulation.  Patient will follow up outpatient with hematologist and PCP for any  further recommendations.  Compliance with medication and fall risk            Discharge Medications:   Current Discharge Medication List      START taking these medications    Details   !! apixaban (ELIQUIS) 5 mg tablet Take 2 Tabs by mouth every twelve (12) hours for 7 days.  Qty: 28 Tab, Refills: 0      lisinopriL (PRINIVIL, ZESTRIL) 10 mg tablet Take 1 Tab by mouth daily for 30 days.  Qty: 30 Tab, Refills: 1      !! apixaban (ELIQUIS) 5 mg tablet Take 1 Tab by mouth two (2) times a day for 30 days.  Qty: 60 Tab, Refills: 1       !! - Potential duplicate medications found. Please discuss with provider.            Follow up Care:    1. Buel Ream, MD in 1-2 weeks.  Please call to set up an appointment shortly after discharge.      Diet:  Cardiac Diet    Disposition:  Home.    Advanced Directive:   FULL    DNR      Discharge Exam:  Visit Vitals  BP 130/89 (BP 1 Location: Left lower arm, BP Patient Position: Semi fowlers)   Pulse 70   Temp 97.6 ??F (  36.4 ??C)   Resp 18   Ht 6' (1.829 m)   Wt 113.4 kg (250 lb)   SpO2 98%   BMI 33.91 kg/m??      O2 Device: None (Room air)    Temp (24hrs), Avg:97.8 ??F (36.6 ??C), Min:97.6 ??F (36.4 ??C), Max:98.4 ??F (36.9 ??C)    No intake/output data recorded.   04/10 1901 - 04/12 0700  In: 200 [P.O.:200]  Out: -     General:  Alert, cooperative, no distress, appears stated age.   Lungs:   Clear to auscultation bilaterally.   Chest wall:  No tenderness or deformity.   Heart:  Regular rate and rhythm, S1, S2 normal, no murmur, click, rub or gallop.   Abdomen:   Soft, non-tender. Bowel sounds normal. No masses,  No organomegaly.   Extremities: Extremities normal, atraumatic, no cyanosis or edema.   Pulses: 2+ and symmetric all extremities.   Skin: Skin color, texture, turgor normal. No rashes or lesions   Neurologic: CNII-XII intact. No gross sensory or motor deficits         CONSULTATIONS: Hematology/Oncology    Significant Diagnostic Studies:   06/07/2019: BUN 15 mg/dL (Ref range: 6 -  20 mg/dL); Calcium 8.6 mg/dL (Ref range: 8.5 - 34.1 mg/dL); CO2 28 mmol/L (Ref range: 21 - 32 mmol/L); Creatinine 1.07 mg/dL (Ref range: 9.62 - 2.29 mg/dL); Glucose 154 mg/dL* (Ref range: 65 - 798 mg/dL); HCT 92.1 % (Ref range: 36.6 - 50.3 %); HGB 14.4 g/dL (Ref range: 19.4 - 17.4 g/dL); Potassium 3.5 mmol/L (Ref range: 3.5 - 5.1 mmol/L); Sodium 139 mmol/L (Ref range: 136 - 145 mmol/L)  06/08/2019: HCT 45.4 % (Ref range: 36.6 - 50.3 %); HGB 14.1 g/dL (Ref range: 08.1 - 44.8 g/dL)  Recent Labs     18/56/31  0345 06/08/19  0530   WBC 4.8 3.8*   HGB 15.3 14.1   HCT 48.0 45.4   PLT 164 152     Recent Labs     06/07/19  2315   NA 139   K 3.5   CL 107   CO2 28   BUN 15   CREA 1.07   GLU 154*   CA 8.6     Recent Labs     06/07/19  2315   ALT 42   AP 84   TBILI 0.3   TP 7.0   ALB 3.4*   GLOB 3.6     Recent Labs     06/08/19  1630 06/08/19  1310 06/08/19  0530   APTT 108.3* 102.3* 32.0      No results for input(s): FE, TIBC, PSAT, FERR in the last 72 hours.   No results for input(s): PH, PCO2, PO2 in the last 72 hours.  No results for input(s): CPK, CKMB in the last 72 hours.    No lab exists for component: TROPONINI  No results found for: GLUCPOC    Total Time: 35 minutes    Signed:  Shelda Pal, MD  06/09/2019  9:07 AM

## 2019-06-09 NOTE — Progress Notes (Signed)
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  Treated with IV heparin now transitioned to Eliquis 10 mg twice daily.  No major interval problems , planning for discharge today.    Subjective:   Patient has no new complaints feels his breathing is improved.  No bleeding or bruising complications no chest pain, shortness of breath today      Current Facility-Administered Medications   Medication Dose Route Frequency   ??? apixaban (ELIQUIS) tablet 10 mg  10 mg Oral Q12H   ??? sodium chloride (NS) flush 5-40 mL  5-40 mL IntraVENous Q8H   ??? sodium chloride (NS) flush 5-40 mL  5-40 mL IntraVENous PRN   ??? acetaminophen (TYLENOL) tablet 650 mg  650 mg Oral Q6H PRN    Or   ??? acetaminophen (TYLENOL) suppository 650 mg  650 mg Rectal Q6H PRN   ??? polyethylene glycol (MIRALAX) packet 17 g  17 g Oral DAILY PRN   ??? promethazine (PHENERGAN) tablet 12.5 mg  12.5 mg Oral Q6H PRN    Or   ??? ondansetron (ZOFRAN) injection 4 mg  4 mg IntraVENous Q6H PRN   ??? lisinopriL (PRINIVIL, ZESTRIL) tablet 10 mg  10 mg Oral DAILY        Review of Systems:    Constitutional No fevers, chills, night sweats, excessive fatigue or weight loss.   Allergic/Immunologic No recent allergic reactions   Eyes No significant visual difficulties. No diplopia.   ENMT No problems with hearing, no sore throat, no sinus drainage.   Endocrine No hot flashes or night sweats. No cold intolerance, polyuria, or polydipsia   Hematologic/Lymphatic No easy bruising or bleeding.  The patient denies any tender or palpable lymph nodes   Breasts No abnormal masses of breast, nipple discharge or pain.   Respiratory No dyspnea on exertion, orthopnea, chest pain, cough or hemoptysis.   Cardiovascular No anginal chest pain, irregular heart beat, tachycardia, palpitations or orthopnea.   Gastrointestinal No nausea, vomiting, diarrhea, constipation, cramping, dysphagia, reflux, heartburn, GI  bleeding, or early satiety.  No change in bowel habits.   Genitourinary (M) No hematuria, dysuria, increased frequency, urgency, hesitancy or incontinence.   Musculoskeletal No joint pain, swelling or redness. No decreased range of motion.   Integumentary No chronic rashes, inflammation, ulcerations, pruritus, petechiae, purpura, ecchymoses, or skin changes.   Neurologic No headache, blurred vision, and no areas of focal weakness or numbness. Normal gait. No sensory problems.   Psychiatric No insomnia, depression, mania or mood swings.  No psychotropic drugs.     Objective:     Patient Vitals for the past 8 hrs:   BP Temp Pulse Resp SpO2   06/09/19 1056 (!) 141/97 98.1 ??F (36.7 ??C) 79 16 97 %   06/09/19 0800 ??? ??? 70 ??? ???   06/09/19 0725 130/89 97.6 ??F (36.4 ??C) 80 18 ???       Temp (24hrs), Avg:97.9 ??F (36.6 ??C), Min:97.6 ??F (36.4 ??C), Max:98.4 ??F (36.9 ??C)      Physical Exam:    Constitutional  African-American male ,alert, cooperative, oriented. Mood and affect appropriate. Appears close to chronological age. Well nourished. Well developed.   Head Normocephalic; no scars   Eyes Conjunctivae and sclerae are clear and without icterus. Pupils are reactive and equal.   ENMT Sinuses are nontender.  No oral exudates, ulcers, masses, thrush or mucositis. Oropharynx clear.  Tongue normal.   Neck Supple without masses or thyromegaly. No jugular venous distension.   Hematologic/Lymphatic No petechiae or purpura.  No tender or palpable lymph nodes in the cervical, supraclavicular, axillary or inguinal area.   Respiratory Lungs are clear to auscultation without rhonchi or wheezing.   Cardiovascular Regular rate and rhythm of heart without murmurs, gallops or rubs.   Chest / Line Site Chest is symmetric with no chest wall deformities.   Abdomen Non-tender, non-distended, no masses, ascites or hepatosplenomegaly. Good bowel sounds. No guarding or rebound tenderness. No pulsatile masses.   Musculoskeletal No tenderness or swelling,  normal range of motion without obvious weakness.   Extremities No visible deformities, no cyanosis, clubbing or edema.    Skin No rashes, scars, or lesions suggestive of malignancy. No petechiae, purpura, or ecchymoses. No excoriations.    Neurologic No sensory or motor deficits, normal cerebellar function, normal gait, cranial nerves intact.   Psychiatric Alert and oriented times three. Coherent speech. Verbalizes understanding of our discussions today.     Lab/Data Review:  Recent Labs     06/09/19  0345 06/08/19  0530 06/07/19  2315   WBC 4.8 3.8* 4.6   HGB 15.3 14.1 14.4   HCT 48.0 45.4 45.6   PLT 164 152 151     Recent Labs     06/07/19  2315   NA 139   K 3.5   CL 107   CO2 28   GLU 154*   BUN 15   CREA 1.07   CA 8.6   ALB 3.4*   TBILI 0.3   ALT 42     No results for input(s): PH, PCO2, PO2, HCO3, FIO2 in the last 72 hours.      Radiology:   Xr Chest Sngl V    Result Date: 06/07/2019  No acute finding.     Cta Chest W Or W Wo Cont    Result Date: 06/08/2019  Right lower lobe pulmonary emboli. Report called to Dr. Elly Modena 2:10 AM.           Assessment Kathyrn Lass:     Active Problems:    Pulmonary embolism ([place]) (06/08/2019)      1. Recurrent DVT/PE:   44 year old man with a history of CVA 2014, DVT and PE 2014 complaining of left lower leg pain and swelling.  Doppler now showed extensive right leg thrombus involving distal femoral vein, popliteal vein and the gastrocnemius vein and mid to proximal posterior tibial vein and peroneal vein.    -Likely he will need lifelong anticoagulation in view of his prior VTE s and now with extensive thrombus.  -Agree with Eliquis 10 twice daily for a week followed by 5 twice daily.    Hypercoagulable work-up has been ordered results are pending.    Patient is instructed to follow-up with Dr. Lorn Junes in about 3 to 4 weeks.    Erroll Luna, MD  06/09/2019

## 2019-06-09 NOTE — Discharge Summary (Signed)
Discharge Summary by Audria Nine, MD at 06/09/19 [phone]                Author: Audria Nine, MD  Service: Internal Medicine  Author Type: Physician       Filed: 06/09/19 0909  Date of Service: 06/09/19 0907  Status: Signed          Editor: Audria Nine, MD (Physician)                                              Physician Discharge Summary        Patient ID:     Gregory Martinez   [id number]   44 y.o.   1975/12/04      Admit date: 06/07/2019      Discharge date : 06/09/2019      Chronic Diagnoses:        Problem List  as of 06/09/2019  Date Reviewed:  06/03/18                        Codes  Class  Noted - Resolved             Pulmonary embolism ([place])  ICD-10-CM: I26.99   ICD-9-CM: 415.19    06/08/2019 - Present                       Severe obesity ([place])  ICD-10-CM: E66.01   ICD-9-CM: 278.01    03/01/2018 - Present                    22      Final Diagnoses:    Pulmonary embolism ([place]) [I26.99]    Acute left leg DVT   Recurrent DVT      Reason for Hospitalization:   Left leg pain and shortness of breath         Hospital Course:       Per H and P,"44 y.o. male with past medical history of previous cerebrovascular accident status post TPA in 2014 presenting to the ER with complaints of left lower extremity swelling.  Mr. Kamel  currently drives 6 hours to [place] and back to [place] every weekend for work.  Over the past week, Mr. Raska has noticed left lower extremity swelling and pain.  Patient reports that on March 6 he was seen by a podiatrist for left heel pain  and diagnosed with plantar fasciitis.  He was prescribed steroids.  Mr. Smock developed left calf pain approximately 1 week later and had follow-up outpatient visit with podiatrist.  Calf pain was thought to be secondary to muscular strain with recent  plantar fasciitis.  He was prescribed naproxen.  Swelling developed approximately 1 week later.  Additional symptoms include mild shortness of breath.  Mr. Breeze describes shortness  of breath as needing to deeply inhale.  Additional complaints include  elevated blood pressure today with a value of 138/94"   Patient was admitted to medical telemetry floor and treated with IV heparin for acute DVT/PE.  Thrombophilic work-up obtain.  Seen in consultation by hematologist and recommended lifelong anticoagulation.  Patient will follow up outpatient with hematologist  and PCP for any further recommendations.  Compliance with medication and fall risk  Discharge Medications:      Current Discharge Medication List              START taking these medications          Details        !! apixaban (ELIQUIS) 5 mg tablet  Take 2 Tabs by mouth every twelve (12) hours for 7 days.   Qty: 28 Tab, Refills:  0               lisinopriL (PRINIVIL, ZESTRIL) 10 mg tablet  Take 1 Tab by mouth daily for 30 days.   Qty: 30 Tab, Refills:  1               !! apixaban (ELIQUIS) 5 mg tablet  Take 1 Tab by mouth two (2) times a day for 30 days.   Qty: 60 Tab, Refills:  1               !! - Potential duplicate medications found. Please discuss with provider.                     Follow up Care:     1. Fay Records, MD in 1-2 weeks.  Please call to set up an appointment shortly after discharge.        Diet:  Cardiac Diet      Disposition:   Home.      Advanced Directive:       FULL          DNR          Discharge Exam:   Visit Vitals      BP  130/89 (BP 1 Location: Left lower arm, BP Patient Position: Semi fowlers)     Pulse  70     Temp  97.6 ??F (36.4 ??C)     Resp  18     Ht  6' (1.829 m)     Wt  113.4 kg (250 lb)     SpO2  98%        BMI  33.91 kg/m??        O2 Device:  None (Room air)      Temp (24hrs), Avg:97.8 ??F (36.6 ??C), Min:97.6 ??F (36.4 ??C), Max:98.4 ??F (36.9 ??C)     No intake/output data recorded.    04/10 1901 - 04/12 0700   In: 200 [P.O.:200]   Out: -          General:   Alert, cooperative, no distress, appears stated age.        Lungs:    Clear to auscultation bilaterally.        Chest wall:   No  tenderness or deformity.        Heart:   Regular rate and rhythm, S1, S2 normal, no murmur, click, rub or gallop.        Abdomen:    Soft, non-tender. Bowel sounds normal. No masses,  No organomegaly.     Extremities:  Extremities normal, atraumatic, no cyanosis or edema.     Pulses:  2+ and symmetric all extremities.     Skin:  Skin color, texture, turgor normal. No rashes or lesions        Neurologic:  CNII-XII intact. No gross sensory or motor deficits              CONSULTATIONS: Hematology/Oncology      Significant Diagnostic Studies:    06/07/2019: BUN 15 mg/dL (Ref range:  6 - 20 mg/dL); Calcium 8.6 mg/dL (Ref range: 8.5 - 76.5 mg/dL); CO2 28 mmol/L (Ref range: 21 - 32 mmol/L); Creatinine 1.07 mg/dL (Ref range: 4.65 - 0.35 mg/dL); Glucose 154 mg/dL* (Ref range: 65 - 465 mg/dL); HCT 68.1  % (Ref range: 36.6 - 50.3 %); HGB 14.4 g/dL (Ref range: 27.5 - 17.0 g/dL); Potassium 3.5 mmol/L (Ref range: 3.5 - 5.1 mmol/L); Sodium 139 mmol/L (Ref range: 136 - 145 mmol/L)   06/08/2019: HCT 45.4 % (Ref range: 36.6 - 50.3 %); HGB 14.1 g/dL (Ref range: 01.7 - 49.4 g/dL)     Recent Labs            06/09/19   0345  06/08/19   0530     WBC  4.8  3.8*     HGB  15.3  14.1     HCT  48.0  45.4         PLT  164  152          Recent Labs           06/07/19   2315     NA  139     K  3.5     CL  107     CO2  28     BUN  15     CREA  1.07     GLU  154*        CA  8.6          Recent Labs           06/07/19   2315     ALT  42     AP  84     TBILI  0.3     TP  7.0     ALB  3.4*        GLOB  3.6          Recent Labs             06/08/19   1630  06/08/19   1310  06/08/19   0530          APTT  108.3*  102.3*  32.0         No results for input(s): FE, TIBC, PSAT, FERR in the last 72 hours.    No results for input(s): PH, PCO2, PO2 in the last 72 hours.   No results for input(s): CPK, CKMB in the last 72 hours.      No lab exists for component: TROPONINI   No results found for: GLUCPOC      Total Time: 35 minutes      Signed:   Shelda Pal,  MD   06/09/2019   9:07 AM

## 2019-06-10 LAB — PROTEIN S ANTIGEN
PROTEIN S, FREE, 164519: 112 % (ref 57–157)
PROTEIN S, TOTAL, 164518: 79 % (ref 60–150)

## 2019-06-10 LAB — PROTEIN S AG,FREE + TOTAL
Protein S, Free: 112 % (ref 57–157)
Protein S, Total: 79 % (ref 60–150)

## 2019-06-11 LAB — PROTEIN C, TOTAL: Protein C Antigen: 75 % (ref 60–150)

## 2019-06-13 LAB — FACTOR II  DNA ANALYSIS

## 2019-06-26 LAB — ANTIPHOSPHOLIPID SYNDROME PANEL
APTT 1:1 NP: 29.1 s
APTT 1:1 Saline: 49.9 s
Anticardiolipin Ab, IgA: <10 [APL'U]
Anticardiolipin Ab, IgG: <10 [GPL'U]
Anticardiolipin Ab, IgM: 12 [MPL'U]
Antiphosphatidylserine IgG: 0 {GPS'U}
Antiphosphatidylserine IgM: 1 {MPS'U}
Beta-2 Glycoprotein I, IgA: <10 SAU
Beta-2 Glycoprotein I, IgG: <10 SGU
Beta-2 Glycoprotein I, IgM: <10 SMU
DRVVT Confirm: 59.4 s
DRVVT Screen: 67.5 s — ABNORMAL HIGH
Hexagonal Phospholipid Neutral: 0 s
Platelet Neutralization: 4.9 s — ABNORMAL HIGH
Prothrombin Ab, IgG: 2 G units
aPTT: 37.4 s — ABNORMAL HIGH
dRVVT ratio: 1 ratio

## 2023-02-02 ENCOUNTER — Encounter: Admit: 2023-02-02

## 2023-02-02 DIAGNOSIS — S83242A Other tear of medial meniscus, current injury, left knee, initial encounter: Secondary | ICD-10-CM

## 2023-02-12 ENCOUNTER — Inpatient Hospital Stay
Admit: 2023-02-12 | Disposition: A | Discharge status: Home / Self Care | Payer: BLUE CROSS/BLUE SHIELD | Source: Ambulatory Visit | Primary: Family Medicine

## 2023-02-12 DIAGNOSIS — S83242A Other tear of medial meniscus, current injury, left knee, initial encounter: Principal | ICD-10-CM
